# Patient Record
Sex: Male | Born: 1990 | Race: White | Hispanic: No | Marital: Single | Smoking: Former smoker
Health system: Southern US, Community
[De-identification: ages and names within clinical notes are randomized; demographics above are authoritative.]

## PROBLEM LIST (undated history)

## (undated) DIAGNOSIS — I471 Supraventricular tachycardia, unspecified: Secondary | ICD-10-CM

## (undated) DIAGNOSIS — F1610 Hallucinogen abuse, uncomplicated: Secondary | ICD-10-CM

## (undated) DIAGNOSIS — F1410 Cocaine abuse, uncomplicated: Secondary | ICD-10-CM

## (undated) DIAGNOSIS — J9811 Atelectasis: Secondary | ICD-10-CM

## (undated) DIAGNOSIS — F419 Anxiety disorder, unspecified: Secondary | ICD-10-CM

## (undated) HISTORY — DX: Anxiety disorder, unspecified: F41.9

## (undated) HISTORY — PX: NO PAST SURGERIES: SHX2092

---

## 2016-09-05 ENCOUNTER — Encounter (HOSPITAL_COMMUNITY): Payer: Self-pay | Admitting: Emergency Medicine

## 2016-09-05 ENCOUNTER — Emergency Department (HOSPITAL_COMMUNITY)
Admission: EM | Admit: 2016-09-05 | Discharge: 2016-09-05 | Disposition: A | Discharge status: Home / Self Care | Payer: BLUE CROSS/BLUE SHIELD | Attending: Emergency Medicine | Admitting: Emergency Medicine

## 2016-09-05 ENCOUNTER — Emergency Department (HOSPITAL_COMMUNITY): Payer: BLUE CROSS/BLUE SHIELD

## 2016-09-05 DIAGNOSIS — D72829 Elevated white blood cell count, unspecified: Secondary | ICD-10-CM | POA: Insufficient documentation

## 2016-09-05 DIAGNOSIS — I471 Supraventricular tachycardia: Secondary | ICD-10-CM | POA: Insufficient documentation

## 2016-09-05 DIAGNOSIS — R079 Chest pain, unspecified: Secondary | ICD-10-CM

## 2016-09-05 DIAGNOSIS — F17210 Nicotine dependence, cigarettes, uncomplicated: Secondary | ICD-10-CM | POA: Insufficient documentation

## 2016-09-05 DIAGNOSIS — R000 Tachycardia, unspecified: Secondary | ICD-10-CM | POA: Diagnosis present

## 2016-09-05 HISTORY — DX: Supraventricular tachycardia: I47.1

## 2016-09-05 HISTORY — DX: Supraventricular tachycardia, unspecified: I47.10

## 2016-09-05 LAB — BASIC METABOLIC PANEL
Anion gap: 15 (ref 5–15)
BUN: 9 mg/dL (ref 6–20)
CALCIUM: 9.6 mg/dL (ref 8.9–10.3)
CO2: 21 mmol/L — AB (ref 22–32)
CREATININE: 1.18 mg/dL (ref 0.61–1.24)
Chloride: 98 mmol/L — ABNORMAL LOW (ref 101–111)
GFR calc non Af Amer: >60 mL/min (ref >60)
Glucose, Bld: 116 mg/dL — ABNORMAL HIGH (ref 65–99)
Potassium: 3.8 mmol/L (ref 3.5–5.1)
Sodium: 134 mmol/L — ABNORMAL LOW (ref 135–145)

## 2016-09-05 LAB — CBC
HCT: 50.9 % (ref 39.0–52.0)
Hemoglobin: 17.9 g/dL — ABNORMAL HIGH (ref 13.0–17.0)
MCH: 32 pg (ref 26.0–34.0)
MCHC: 35.2 g/dL (ref 30.0–36.0)
MCV: 90.9 fL (ref 78.0–100.0)
PLATELETS: 260 10*3/uL (ref 150–400)
RBC: 5.6 MIL/uL (ref 4.22–5.81)
RDW: 13.1 % (ref 11.5–15.5)
WBC: 20.6 10*3/uL — ABNORMAL HIGH (ref 4.0–10.5)

## 2016-09-05 LAB — I-STAT TROPONIN, ED: TROPONIN I, POC: 0.06 ng/mL (ref 0.00–0.08)

## 2016-09-05 LAB — I-STAT CHEM 8, ED
BUN: 8 mg/dL (ref 6–20)
CREATININE: 1.2 mg/dL (ref 0.61–1.24)
Calcium, Ion: 1.07 mmol/L — ABNORMAL LOW (ref 1.15–1.40)
Chloride: 106 mmol/L (ref 101–111)
GLUCOSE: 115 mg/dL — AB (ref 65–99)
HEMATOCRIT: 54 % — AB (ref 39.0–52.0)
HEMOGLOBIN: 18.4 g/dL — AB (ref 13.0–17.0)
Potassium: 4 mmol/L (ref 3.5–5.1)
Sodium: 138 mmol/L (ref 135–145)
TCO2: 21 mmol/L (ref 0–100)

## 2016-09-05 LAB — ETHANOL: Alcohol, Ethyl (B): 36 mg/dL — ABNORMAL HIGH (ref <5)

## 2016-09-05 MED ORDER — ADENOSINE 6 MG/2ML IV SOLN
INTRAVENOUS | Status: AC
  Administered 2016-09-05: 6 mg via INTRAVENOUS
  Filled 2016-09-05: qty 10

## 2016-09-05 MED ORDER — ADENOSINE 6 MG/2ML IV SOLN
12.0000 mg | Freq: Once | INTRAVENOUS | Status: AC
  Administered 2016-09-05: 12 mg via INTRAVENOUS

## 2016-09-05 MED ORDER — SODIUM CHLORIDE 0.9 % IV BOLUS (SEPSIS)
1000.0000 mL | Freq: Once | INTRAVENOUS | Status: AC
  Administered 2016-09-05: 1000 mL via INTRAVENOUS

## 2016-09-05 MED ORDER — LORAZEPAM 2 MG/ML IJ SOLN
INTRAMUSCULAR | Status: AC
  Administered 2016-09-05: 1 mg
  Filled 2016-09-05: qty 1

## 2016-09-05 MED ORDER — ADENOSINE 6 MG/2ML IV SOLN
6.0000 mg | Freq: Once | INTRAVENOUS | Status: AC
  Administered 2016-09-05: 6 mg via INTRAVENOUS

## 2016-09-05 NOTE — ED Provider Notes (Signed)
[facility]-EMERGENCY DEPT Provider Note   CSN: [id number] Arrival date & time: 09/05/16  1319     History   Chief Complaint Chief Complaint  Patient presents with  . Irregular Heart Beat    HPI Rozanna BoerHayden Thwaites is a 25 y.o. male.  The history is provided by the patient. No language interpreter was used.   Rozanna BoerHayden Oltmann is a 25 y.o. male who presents to the Emergency Department complaining of fast heart beat.  At 9 am he developed sudden onset chest pain, palpitations, nausea, vomiting, SOB and malaise.  He has a hx/o SVT in the past and was treated and [place] at a hospital in [place] and was discharged home on no meds.  He does endorse drinking alcohol and taking an adderall last night.  No SI.  Sxs are severe and constant in nature.    Past Medical History:  Diagnosis Date  . PSVT (paroxysmal supraventricular tachycardia) (HCC)     There are no active problems to display for this patient.   History reviewed. No pertinent surgical history.     Home Medications    Prior to Admission medications   Not on File    Family History No family history on file.  Social History Social History  Substance Use Topics  . Smoking status: Current Every Day Smoker    Types: Cigarettes  . Smokeless tobacco: Never Used  . Alcohol use Yes     Allergies   Patient has no known allergies.   Review of Systems Review of Systems  All other systems reviewed and are negative.    Physical Exam Updated Vital Signs Ht 6\' 1"  (1.854 m)   Wt 140 lb (63.5 kg)   BMI 18.47 kg/m   Physical Exam  Constitutional: He is oriented to person, place, and time. He appears well-developed and well-nourished. He appears distressed.  Ilk appearing  HENT:  Head: Normocephalic and atraumatic.  Cardiovascular: Regular rhythm.   No murmur heard. tachycardic  Pulmonary/Chest: Effort normal and breath sounds normal. No respiratory distress.  Abdominal: Soft. There is no rebound and no guarding.  Mild  abdominal tenderness  Musculoskeletal: He exhibits no edema or tenderness.  Neurological: He is alert and oriented to person, place, and time.  Skin: Skin is warm and dry. There is pallor.  Psychiatric: He has a normal mood and affect. His behavior is normal.  Nursing note and vitals reviewed.    ED Treatments / Results  Labs (all labs ordered are listed, but only abnormal results are displayed) Labs Reviewed  BASIC METABOLIC PANEL  CBC  ETHANOL  I-STAT TROPOININ, ED  I-STAT CHEM 8, ED  I-STAT TROPOININ, ED    EKG  EKG Interpretation None       Radiology No results found.  Procedures Procedures (including critical care time) Chemical cardioversion. Verbal consent obtained from the patient prior to procedure. He was placed on defibrillator pads with constant monitoring and oxygen. Indication: SVT  He was given 6 mg of adenosine with no change in his SVT. He was treated with a second dose of 12 mg adenosine subsequent change to sinus tachycardia. Patient tolerated the procedure well.  Medications Ordered in ED Medications  adenosine (ADENOCARD) 6 MG/2ML injection (not administered)  sodium chloride 0.9 % bolus 1,000 mL (not administered)  adenosine (ADENOCARD) 6 MG/2ML injection 6 mg (not administered)     Initial Impression / Assessment and Plan / ED Course  I have reviewed the triage vital signs and the nursing notes.  Pertinent labs & imaging results that were available during my care of the patient were reviewed by me and considered in my medical decision making (see chart for details).  Clinical Course   Patient with history of SVT here with recurrent symptoms. He states that he was told to avoid stimulants and did take an Adderall last night. He was monitored in the emergency department following chemical cardioversion and tolerated well with improvement in his symptoms. Current clinical picture is not consistent with ACS, PE, sepsis, pneumonia. Discussed  outpatient cardiology follow-up, avoiding stimulants, return precautions.  Final Clinical Impressions(s) / ED Diagnoses   Final diagnoses:  SVT (supraventricular tachycardia) (HCC)  Leukocytosis, unspecified type    New Prescriptions New Prescriptions   No medications on file     Tilden FossaElizabeth Hayzen Lorenson, MD 09/08/16 [phone]

## 2016-09-05 NOTE — ED Notes (Signed)
I have just assisted him to ambulate, which he tolerates well and his heart rate while ambulating is ~90 b.p.m. And is sinus rhythm without ectopy.  He denies any pain and has no requests. His parents are with him and I inform them that his d/c is imminent.

## 2016-09-05 NOTE — ED Notes (Signed)
His skin is normal, warm and dry and he is breathing normally. He is comfortably sleeping. Earlier he had made a phone call to his mom. Monitor shows nsr without ectopy.

## 2016-09-05 NOTE — Discharge Instructions (Signed)
Your white blood cell count is elevated today.  Please follow up with your family doctor to have this rechecked.  Get rechecked immediately if you develop new or worrisome symptoms.

## 2016-09-05 NOTE — ED Triage Notes (Signed)
Patient states that he has PSVT that has been diagnosed with since March this year. Patient reports this episode started this morning around 9am.  Patient having chest pain and nausea.

## 2017-03-21 ENCOUNTER — Emergency Department (HOSPITAL_COMMUNITY)
Admission: EM | Admit: 2017-03-21 | Discharge: 2017-03-21 | Disposition: A | Discharge status: Home / Self Care | Payer: BLUE CROSS/BLUE SHIELD | Attending: Emergency Medicine | Admitting: Emergency Medicine

## 2017-03-21 ENCOUNTER — Encounter (HOSPITAL_COMMUNITY): Payer: Self-pay | Admitting: *Deleted

## 2017-03-21 DIAGNOSIS — F1410 Cocaine abuse, uncomplicated: Secondary | ICD-10-CM

## 2017-03-21 DIAGNOSIS — I471 Supraventricular tachycardia: Secondary | ICD-10-CM

## 2017-03-21 DIAGNOSIS — F17210 Nicotine dependence, cigarettes, uncomplicated: Secondary | ICD-10-CM | POA: Insufficient documentation

## 2017-03-21 DIAGNOSIS — R002 Palpitations: Secondary | ICD-10-CM | POA: Diagnosis present

## 2017-03-21 LAB — CBC
HEMATOCRIT: 45 % (ref 39.0–52.0)
Hemoglobin: 15.5 g/dL (ref 13.0–17.0)
MCH: 30.8 pg (ref 26.0–34.0)
MCHC: 34.4 g/dL (ref 30.0–36.0)
MCV: 89.3 fL (ref 78.0–100.0)
PLATELETS: 228 10*3/uL (ref 150–400)
RBC: 5.04 MIL/uL (ref 4.22–5.81)
RDW: 13 % (ref 11.5–15.5)
WBC: 7.8 10*3/uL (ref 4.0–10.5)

## 2017-03-21 LAB — BASIC METABOLIC PANEL
Anion gap: 11 (ref 5–15)
BUN: 8 mg/dL (ref 6–20)
CHLORIDE: 109 mmol/L (ref 101–111)
CO2: 20 mmol/L — ABNORMAL LOW (ref 22–32)
CREATININE: 0.98 mg/dL (ref 0.61–1.24)
Calcium: 9 mg/dL (ref 8.9–10.3)
GFR calc Af Amer: >60 mL/min (ref >60)
GLUCOSE: 123 mg/dL — AB (ref 65–99)
Potassium: 4 mmol/L (ref 3.5–5.1)
Sodium: 140 mmol/L (ref 135–145)

## 2017-03-21 LAB — I-STAT TROPONIN, ED: Troponin i, poc: 0.01 ng/mL (ref 0.00–0.08)

## 2017-03-21 MED ORDER — ADENOSINE 6 MG/2ML IV SOLN
12.0000 mg | Freq: Once | INTRAVENOUS | Status: AC
  Administered 2017-03-21: 12 mg via INTRAVENOUS

## 2017-03-21 MED ORDER — ADENOSINE 6 MG/2ML IV SOLN
6.0000 mg | Freq: Once | INTRAVENOUS | Status: AC
  Administered 2017-03-21: 6 mg via INTRAVENOUS

## 2017-03-21 NOTE — ED Provider Notes (Signed)
[facility]-EMERGENCY DEPT Provider Note   CSN: [id number] Arrival date & time: 03/21/17  7829   By signing my name below, I, Clarisse Gouge, attest that this documentation has been prepared under the direction and in the presence of Dione Booze, MD. Electronically signed, Clarisse Gouge, ED Scribe. 03/21/17. 3:42 AM.   History   Chief Complaint Chief Complaint  Patient presents with  . SVT   The history is provided by the patient and medical records. No language interpreter was used.    Casey Henry is a 26 y.o. male with h/o PSVT BIB EMS to the Emergency Department concerning tachycardia onset ~10-11 PM last nigh. Associated chest pain and palpitations. He states he got news his cousin was murdered ~10-11 PM last night while the relative was working Office manager at [employer] in [employer] [employer], and pt states he has been in SVT ever since getting this news. Pt expresses a lot anxiety about this situation. He states he has attempted to drink water and vagal maneuvers without relief. He states this is the 3rd instance of SVT, stating his last episode was 09/06/2017. Past cardiology workups noted. ETOH, cocaine and marijuana use noted x the past 3 nights. Triage notes "small amount of cocaine" and alcohol PTA; he reports no cocaine, drug or ETOH use last night. No SOB, fever, diaphoresis. No other complaints at this time.   Past Medical History:  Diagnosis Date  . PSVT (paroxysmal supraventricular tachycardia) (HCC)     There are no active problems to display for this patient.   History reviewed. No pertinent surgical history.     Home Medications    Prior to Admission medications   Not on File    Family History No family history on file.  Social History Social History  Substance Use Topics  . Smoking status: Current Every Day Smoker    Types: Cigarettes  . Smokeless tobacco: Never Used  . Alcohol use Yes     Allergies   Patient has no known allergies.   Review of Systems Review of  Systems  Constitutional: Negative for chills, diaphoresis and fever.  Respiratory: Negative for shortness of breath.   Cardiovascular: Positive for chest pain and palpitations.  Gastrointestinal: Negative for nausea and vomiting.  Psychiatric/Behavioral: The patient is nervous/anxious.   All other systems reviewed and are negative.    Physical Exam Updated Vital Signs BP 92/72   Pulse (!) 223   Resp 16   SpO2 98%   Physical Exam  Constitutional: He is oriented to person, place, and time. He appears well-developed and well-nourished.  HENT:  Head: Normocephalic and atraumatic.  Eyes: Pupils are equal, round, and reactive to light. EOM are normal.  Neck: Normal range of motion. Neck supple. No JVD present.  Cardiovascular: Regular rhythm and normal heart sounds.  Tachycardia present.   No murmur heard. Pulmonary/Chest: Effort normal and breath sounds normal. He has no wheezes. He has no rales. He exhibits no tenderness.  Abdominal: Soft. Bowel sounds are normal. He exhibits no distension and no mass. There is no tenderness.  Musculoskeletal: Normal range of motion. He exhibits no edema.  Lymphadenopathy:    He has no cervical adenopathy.  Neurological: He is alert and oriented to person, place, and time. No cranial nerve deficit. He exhibits normal muscle tone. Coordination normal.  Skin: Skin is warm and dry. No rash noted.  Psychiatric: His behavior is normal. Judgment and thought content normal. His affect is labile.  Nursing note and vitals reviewed.  ED Treatments / Results  DIAGNOSTIC STUDIES: Oxygen Saturation is 98% on RA, NL by my interpretation.    COORDINATION OF CARE: 3:33 AM-Discussed next steps with pt. Pt verbalized understanding and is agreeable with the plan. Will observe and refer to cardiologist.   Labs (all labs ordered are listed, but only abnormal results are displayed) Labs Reviewed  BASIC METABOLIC PANEL - Abnormal; Notable for the following:        Result Value   CO2 20 (*)    Glucose, Bld 123 (*)    All other components within normal limits  CBC  I-STAT TROPOININ, ED    Procedures Procedures (including critical care time) Procedure: Chemical cardioversion Indication: Paroxysmal supraventricular tachycardia Written consent obtained: No Verbal consent obtained: Yes Alternatives discussed: Yes-electrical cardioversion, ED observation Risks of procedure discussed: Yes-asystole following administration of comparison, chest discomfort during administration of adenosine Prior to procedure being done, a timeout was performed Patient identified verbally and with hospital name bracelet He was given 6 mg adenosine with no change in rhythm. Is given 12 mg of adenosine with successful conversion to sinus rhythm, and improvement in blood pressure. He is observed in the ED with stability of this sinus rhythm.  CRITICAL CARE Performed by: Dione BoozeGLICK,Jashaun Penrose Total critical care time: 35 minutes Critical care time was exclusive of separately billable procedures and treating other patients. Critical care was necessary to treat or prevent imminent or life-threatening deterioration. Critical care was time spent personally by me on the following activities: development of treatment plan with patient and/or surrogate as well as nursing, discussions with consultants, evaluation of patient's response to treatment, examination of patient, obtaining history from patient or surrogate, ordering and performing treatments and interventions, ordering and review of laboratory studies, ordering and review of radiographic studies, pulse oximetry and re-evaluation of patient's condition.  Medications Ordered in ED Medications  adenosine (ADENOCARD) 6 MG/2ML injection 6 mg (6 mg Intravenous Given 03/21/17 0330)  adenosine (ADENOCARD) 6 MG/2ML injection 12 mg (12 mg Intravenous Given 03/21/17 0332)     Initial Impression / Assessment and Plan / ED Course  I have  reviewed the triage vital signs and the nursing notes.  Pertinent lab results that were available during my care of the patient were reviewed by me and considered in my medical decision making (see chart for details).  Paroxysmal supraventricular tachycardia. Old records are reviewed, and he has prior ED visit for same condition. Carotid sinus massage attempted with no change in rhythm. He is given adenosine 6 mg with no relief. Is given adenosine 12 mg with successful conversion to sinus rhythm. He is observed in the ED and has had stable rhythm since conversion. He does admit to cocaine use and is advised to abstain from cocaine, amphetamines, and alcohol. He is referred to cardiology for follow-up.  Final Clinical Impressions(s) / ED Diagnoses   Final diagnoses:  PSVT (paroxysmal supraventricular tachycardia) (HCC)  Cocaine abuse    New Prescriptions New Prescriptions   No medications on file   I personally performed the services described in this documentation, which was scribed in my presence. The recorded information has been reviewed and is accurate.       Dione BoozeGlick, Darcey Cardy, MD 03/21/17 [phone]

## 2017-03-21 NOTE — Discharge Instructions (Signed)
Do not use cocaine or other stimulants. Do not drink alcohol. Any of these could cause you to go back into PSVT.

## 2017-03-21 NOTE — ED Notes (Signed)
ED Provider at bedside. 

## 2017-03-21 NOTE — ED Triage Notes (Signed)
Pt received news around midnight about a family member getting murdered; shortly after the news patient felt himself go into SVT. Pt tried vagal maneuvers without improvement. Pt admits to "small amount of cocaine" and alcohol tonight.

## 2020-05-05 ENCOUNTER — Other Ambulatory Visit: Payer: Self-pay

## 2020-05-05 ENCOUNTER — Encounter (HOSPITAL_COMMUNITY): Payer: Self-pay | Admitting: *Deleted

## 2020-05-05 ENCOUNTER — Emergency Department (HOSPITAL_COMMUNITY): Payer: 59

## 2020-05-05 ENCOUNTER — Emergency Department (HOSPITAL_COMMUNITY)
Admission: EM | Admit: 2020-05-05 | Discharge: 2020-05-05 | Disposition: A | Discharge status: Home / Self Care | Payer: 59 | Attending: Emergency Medicine | Admitting: Emergency Medicine

## 2020-05-05 DIAGNOSIS — I471 Supraventricular tachycardia: Secondary | ICD-10-CM | POA: Diagnosis not present

## 2020-05-05 DIAGNOSIS — F17210 Nicotine dependence, cigarettes, uncomplicated: Secondary | ICD-10-CM | POA: Diagnosis not present

## 2020-05-05 DIAGNOSIS — R000 Tachycardia, unspecified: Secondary | ICD-10-CM | POA: Diagnosis present

## 2020-05-05 LAB — BASIC METABOLIC PANEL
Anion gap: 13 (ref 5–15)
BUN: 11 mg/dL (ref 6–20)
CO2: 22 mmol/L (ref 22–32)
Calcium: 9.3 mg/dL (ref 8.9–10.3)
Chloride: 102 mmol/L (ref 98–111)
Creatinine, Ser: 1.04 mg/dL (ref 0.61–1.24)
GFR calc Af Amer: >60 mL/min (ref >60)
GFR calc non Af Amer: >60 mL/min (ref >60)
Glucose, Bld: 126 mg/dL — ABNORMAL HIGH (ref 70–99)
Potassium: 4 mmol/L (ref 3.5–5.1)
Sodium: 137 mmol/L (ref 135–145)

## 2020-05-05 LAB — CBC
HCT: 44.9 % (ref 39.0–52.0)
Hemoglobin: 14.9 g/dL (ref 13.0–17.0)
MCH: 30 pg (ref 26.0–34.0)
MCHC: 33.2 g/dL (ref 30.0–36.0)
MCV: 90.5 fL (ref 80.0–100.0)
Platelets: 230 10*3/uL (ref 150–400)
RBC: 4.96 MIL/uL (ref 4.22–5.81)
RDW: 12.3 % (ref 11.5–15.5)
WBC: 10.2 10*3/uL (ref 4.0–10.5)
nRBC: 0 % (ref 0.0–0.2)

## 2020-05-05 LAB — TROPONIN I (HIGH SENSITIVITY): Troponin I (High Sensitivity): 3 ng/L (ref <18)

## 2020-05-05 MED ORDER — SODIUM CHLORIDE 0.9 % IV BOLUS (SEPSIS)
1000.0000 mL | Freq: Once | INTRAVENOUS | Status: AC
  Administered 2020-05-05: 1000 mL via INTRAVENOUS

## 2020-05-05 MED ORDER — ONDANSETRON HCL 4 MG/2ML IJ SOLN
INTRAMUSCULAR | Status: AC
  Administered 2020-05-05: 4 mg
  Filled 2020-05-05: qty 2

## 2020-05-05 MED ORDER — ADENOSINE 6 MG/2ML IV SOLN
12.0000 mg | Freq: Once | INTRAVENOUS | Status: AC
  Administered 2020-05-05: 12 mg via INTRAVENOUS

## 2020-05-05 MED ORDER — ADENOSINE 6 MG/2ML IV SOLN
INTRAVENOUS | Status: AC
Start: 2020-05-05 — End: 2020-05-05
  Administered 2020-05-05: 05:00:00 6 mg
  Filled 2020-05-05: qty 6

## 2020-05-05 MED ORDER — SODIUM CHLORIDE 0.9 % IV SOLN: 1000.0000 mL | INTRAVENOUS | Status: DC

## 2020-05-05 NOTE — Discharge Instructions (Signed)
Please refrain from using any drugs that will trigger an SVT episode including alcohol, cocaine, amphetamine.  Please continue to hydrate orally.

## 2020-05-05 NOTE — ED Provider Notes (Signed)
[facility] EMERGENCY DEPARTMENT Provider Note  CSN: [id number] Arrival date & time: 05/05/20 4097  Chief Complaint(s) Tachycardia  HPI Casey Henry is a 29 y.o. male with a past medical history of paroxysmal SVT presents to the emergency department with rapid heart rate similar to his prior episodes.  Patient reports that symptoms started 30 to 45 minutes prior to arrival.  Patient did endorse drinking liquor, using marijuana and cocaine earlier this evening.  Patient endorsing associated nausea, shortness of breath, and chest discomfort.  No abdominal pain.  No recent fevers or infections.  He reports attempting vagal maneuvers at home which were unsuccessful.  He reports that he has had multiple episodes requiring adenosine.  HPI  Past Medical History Past Medical History:  Diagnosis Date  . PSVT (paroxysmal supraventricular tachycardia) (HCC)    There are no problems to display for this patient.  Home Medication(s) Prior to Admission medications   Not on File                                                                                                                                    Past Surgical History History reviewed. No pertinent surgical history. Family History No family history on file.  Social History Social History   Tobacco Use  . Smoking status: Current Every Day Smoker    Types: Cigarettes  . Smokeless tobacco: Never Used  Substance Use Topics  . Alcohol use: Yes  . Drug use: Yes    Types: Marijuana, Cocaine   Allergies Patient has no known allergies.  Review of Systems Review of Systems All other systems are reviewed and are negative for acute change except as noted in the HPI  Physical Exam Vital Signs  I have reviewed the triage vital signs BP (!) 77/58 (BP Location: Left Arm)   Pulse (!) 233   Temp 98 F (36.7 C) (Oral)   Resp 20   SpO2 98%   Physical Exam Vitals reviewed.  Constitutional:      General: He is not  in acute distress.    Appearance: He is well-developed. He is not diaphoretic.  HENT:     Head: Normocephalic and atraumatic.     Nose: Nose normal.  Eyes:     General: No scleral icterus.       Right eye: No discharge.        Left eye: No discharge.     Conjunctiva/sclera: Conjunctivae normal.     Pupils: Pupils are equal, round, and reactive to light.  Cardiovascular:     Rate and Rhythm: Regular rhythm. Tachycardia present.     Heart sounds: No murmur heard.  No friction rub. No gallop.   Pulmonary:     Effort: Pulmonary effort is normal. No respiratory distress.     Breath sounds: Normal breath sounds. No stridor. No rales.  Abdominal:     General: There is no distension.  Palpations: Abdomen is soft.     Tenderness: There is no abdominal tenderness.  Musculoskeletal:        General: No tenderness.     Cervical back: Normal range of motion and neck supple.  Skin:    General: Skin is warm and dry.     Coloration: Skin is pale.     Findings: No erythema or rash.  Neurological:     Mental Status: He is alert and oriented to person, place, and time.     ED Results and Treatments Labs (all labs ordered are listed, but only abnormal results are displayed) Labs Reviewed  BASIC METABOLIC PANEL - Abnormal; Notable for the following components:      Result Value   Glucose, Bld 126 (*)    All other components within normal limits  CBC  RAPID URINE DRUG SCREEN, HOSP PERFORMED  TROPONIN I (HIGH SENSITIVITY)  TROPONIN I (HIGH SENSITIVITY)                                                                                                                         EKG  EKG Interpretation  Date/Time:  Sunday May 05 2020 04:38:23 EDT Ventricular Rate:  230 PR Interval:    QRS Duration: 86 QT Interval:  190 QTC Calculation: 371 R Axis:   95 Text Interpretation: Supraventricular tachycardia Rightward axis Marked ST abnormality, possible inferior subendocardial injury Abnormal  ECG Confirmed by Drema Pry [phone]) on 05/05/2020 6:09:22 AM       EKG Interpretation  Date/Time:  Sunday May 05 2020 04:59:52 EDT Ventricular Rate:  77 PR Interval:    QRS Duration: 99 QT Interval:  383 QTC Calculation: 434 R Axis:   94 Text Interpretation: Sinus rhythm Atrial premature complex Borderline right axis deviation RSR' in V1 or V2, probably normal variant Minimal ST depression, lateral leads SVT resolved Confirmed by Drema Pry [phone]) on 05/05/2020 6:11:21 AM        Radiology No results found.  Pertinent labs & imaging results that were available during my care of the patient were reviewed by me and considered in my medical decision making (see chart for details).  Medications Ordered in ED Medications  sodium chloride 0.9 % bolus 1,000 mL (0 mLs Intravenous Stopped 05/05/20 0548)    Followed by  sodium chloride 0.9 % bolus 1,000 mL (1,000 mLs Intravenous New Bag/Given 05/05/20 0548)    Followed by  0.9 %  sodium chloride infusion (has no administration in time range)  adenosine (ADENOCARD) 6 MG/2ML injection (6 mg  Given 05/05/20 0459)  ondansetron (ZOFRAN) 4 MG/2ML injection (4 mg  Given 05/05/20 0459)  adenosine (ADENOCARD) 6 MG/2ML injection 12 mg (12 mg Intravenous Given 05/05/20 0459)  Procedures .1-3 Lead EKG Interpretation Performed by: Nira Connardama, Serenah Mill Eduardo, MD Authorized by: Nira Connardama, Numan Zylstra Eduardo, MD     Interpretation: abnormal     ECG rate:  228   ECG rate assessment: tachycardic     Rhythm: SVT     Ectopy: none     Conduction: normal   .Cardioversion  Date/Time: 05/05/2020 6:12 AM Performed by: Nira Connardama, Maddex Garlitz Eduardo, MD Authorized by: Nira Connardama, Elloise Roark Eduardo, MD   Consent:    Consent obtained:  Verbal and emergent situation   Consent given by:  Patient   Risks discussed:  Induced arrhythmia and death    Alternatives discussed:  Delayed treatment Pre-procedure details:    Cardioversion basis:  Emergent   Rhythm:  Supraventricular tachycardia   Electrode placement:  Anterior-lateral Patient sedated: No Attempt one:    Cardioversion mode attempt one: 6 mg of adenosine.   Shock outcome:  No change in rhythm Attempt two:    Cardioversion mode attempt two: 12 mg of adenosine.   Shock outcome:  Conversion to normal sinus rhythm Post-procedure details:    Patient status:  Awake   Patient tolerance of procedure:  Tolerated well, no immediate complications .Critical Care Performed by: Nira Connardama, Katilynn Sinkler Eduardo, MD Authorized by: Nira Connardama, Rollan Roger Eduardo, MD    CRITICAL CARE Performed by: Amadeo GarnetPedro Eduardo Jaeleah Smyser Total critical care time: 34 minutes Critical care time was exclusive of separately billable procedures and treating other patients. Critical care was necessary to treat or prevent imminent or life-threatening deterioration. Critical care was time spent personally by me on the following activities: development of treatment plan with patient and/or surrogate as well as nursing, discussions with consultants, evaluation of patient's response to treatment, examination of patient, obtaining history from patient or surrogate, ordering and performing treatments and interventions, ordering and review of laboratory studies, ordering and review of radiographic studies, pulse oximetry and re-evaluation of patient's condition.    (including critical care time)  Medical Decision Making / ED Course I have reviewed the nursing notes for this encounter and the patient's prior records (if available in EHR or on provided paperwork).   Rozanna BoerHayden Peek was evaluated in Emergency Department on 05/05/2020 for the symptoms described in the history of present illness. He was evaluated in the context of the global COVID-19 pandemic, which necessitated consideration that the patient might be at risk for infection with  the SARS-CoV-2 virus that causes COVID-19. Institutional protocols and algorithms that pertain to the evaluation of patients at risk for COVID-19 are in a state of rapid change based on information released by regulatory bodies including the CDC and federal and state organizations. These policies and algorithms were followed during the patient's care in the ED.  Patient presents in SVT.  Rates in the 220s.  Patient is hypotensive. Zoll pads immediately placed.  Patient was given IV fluid bolus.  Vagal maneuvers attempted but unsuccessful.  Initial adenosine dose was unsuccessful.  We were able to convert to normal sinus rhythm with 12 mg of adenosine.  This immediately improved the patient's blood pressures which are now in the mid 90s.  We will continue to hydrate and monitor.  Screening labs reassuring.  Chest x-ray reassuring.  Monitor for 2 hours without recurrence. Able to tolerate oral intake.      Final Clinical Impression(s) / ED Diagnoses Final diagnoses:  SVT (supraventricular tachycardia) (HCC)    The patient appears reasonably screened and/or stabilized for discharge and I doubt any other medical condition or other [facility] requiring further screening,  evaluation, or treatment in the ED at this time prior to discharge. Safe for discharge with strict return precautions.  Disposition: Discharge  Condition: Good  I have discussed the results, Dx and Tx plan with the patient/family who expressed understanding and agree(s) with the plan. Discharge instructions discussed at length. The patient/family was given strict return precautions who verbalized understanding of the instructions. No further questions at time of discharge.    ED Discharge Orders    None      Follow Up: Primary care provider  Schedule an appointment as soon as possible for a visit  As needed     This chart was dictated using voice recognition software.  Despite best efforts to proofread,  errors can  occur which can change the documentation meaning.   Nira Conn, MD 05/05/20 [phone]

## 2020-05-05 NOTE — ED Triage Notes (Signed)
Pt states rapid heart rate for about 45 minutes, states hx of SVT. Use of ETOH and marijuana tonight.

## 2020-06-27 ENCOUNTER — Ambulatory Visit: Payer: 59 | Admitting: Internal Medicine

## 2020-07-05 ENCOUNTER — Ambulatory Visit (INDEPENDENT_AMBULATORY_CARE_PROVIDER_SITE_OTHER): Payer: 59 | Admitting: Internal Medicine

## 2020-07-05 ENCOUNTER — Other Ambulatory Visit: Payer: Self-pay

## 2020-07-05 ENCOUNTER — Encounter: Payer: Self-pay | Admitting: Internal Medicine

## 2020-07-05 VITALS — BP 90/60 | HR 82 | Ht 73.0 in | Wt 128.0 lb

## 2020-07-05 DIAGNOSIS — F322 Major depressive disorder, single episode, severe without psychotic features: Secondary | ICD-10-CM

## 2020-07-05 DIAGNOSIS — I471 Supraventricular tachycardia: Secondary | ICD-10-CM | POA: Insufficient documentation

## 2020-07-05 NOTE — Patient Instructions (Addendum)
Medication Instructions:  *If you need a refill on your cardiac medications before your next appointment, please call your pharmacy*  Lab Work: If you have labs (blood work) drawn today and your tests are completely normal, you will receive your results only by: Marland Kitchen MyChart Message (if you have MyChart) OR . A paper copy in the mail If you have any lab test that is abnormal or we need to change your treatment, we will call you to review the results.  Testing/Procedures: Your physician has requested that you have an echocardiogram. Echocardiography is a painless test that uses sound waves to create images of your heart. It provides your doctor with information about the size and shape of your heart and how well your heart's chambers and valves are working. This procedure takes approximately one hour. There are no restrictions for this procedure.  Follow-Up: At [facility], you and your health needs are our priority.  As part of our continuing mission to provide you with exceptional heart care, we have created designated Provider Care Teams.  These Care Teams include your primary Cardiologist (physician) and Advanced Practice Providers (APPs -  Physician Assistants and Nurse Practitioners) who all work together to provide you with the care you need, when you need it.  We recommend signing up for the patient portal called "MyChart".  Sign up information is provided on this After Visit Summary.  MyChart is used to connect with patients for Virtual Visits (Telemedicine).  Patients are able to view lab/test results, encounter notes, upcoming appointments, etc.  Non-urgent messages can be sent to your provider as well.   To learn more about what you can do with MyChart, go to [email or web address].    Your next appointment:   6 month(s)  The format for your next appointment:   In Person  Provider:   You may see Dr. Izora Ribas or one of the following Advanced Practice Providers on your  designated Care Team:    Ronie Spies, PA-C  Jacolyn Reedy, PA-C

## 2020-07-05 NOTE — Progress Notes (Signed)
Cardiology Office Note:    Date:  07/05/2020   ID:  Casey Henry, DOB October 21, 1990, MRN [id number]  PCP:  Tally Joe, MD  [facility] Cardiologist:  No primary care provider on file.  Previously saw [facility] ([place] 2017) [facility] Electrophysiologist:  None   CC: palpitations Consulted for the evaluation of SVT at the behest of Tally Joe, MD  History of Present Illness:    Casey Henry is a 29 y.o. male with a hx of PSVT (seen at [facility] 2017, with SVT in 200 bpm with structurally normal heart).  First SVT episodes was 2013.   Patient was skating with his dad and had a red bull and had tachycardia; felt near syncope.  Didn't have these symptoms until 2017 and have tachycardia after standing up too quickly.  Had been triggers that include stress.  Has always abated with adenosine.  Never been on suppressive medications.  Has stopped caffeine and marijuana as a possible trigger and have stopped them both.  Has had this in the realm of 6 times since 2017.  Can abate rhythm with cold water or valsalva.  Past Medical History:  Diagnosis Date  . Anxiety   . PSVT (paroxysmal supraventricular tachycardia) (HCC)     Past Surgical History:  Procedure Laterality Date  . NO PAST SURGERIES      Current Medications: Current Meds  Medication Sig  . clonazePAM (KLONOPIN) 0.5 MG tablet Take 0.5 mg by mouth daily.  Marland Kitchen escitalopram (LEXAPRO) 5 MG tablet Take 5 mg by mouth daily.  . naproxen sodium (ALEVE) 220 MG tablet Take 220-440 mg by mouth daily as needed (pain).     Allergies:   Patient has no known allergies.   Social History   Socioeconomic History  . Marital status: Single    Spouse name: Not on file  . Number of children: Not on file  . Years of education: Not on file  . Highest education level: Not on file  Occupational History  . Not on file  Tobacco Use  . Smoking status: Current Every Day Smoker    Types: Cigarettes  . Smokeless  tobacco: Never Used  Substance and Sexual Activity  . Alcohol use: Yes  . Drug use: Yes    Types: Marijuana, Cocaine  . Sexual activity: Not on file  Other Topics Concern  . Not on file  Social History Narrative  . Not on file   Social Determinants of Health   Financial Resource Strain:   . Difficulty of Paying Living Expenses: Not on file  Food Insecurity:   . Worried About Programme researcher, broadcasting/film/video in the Last Year: Not on file  . Ran Out of Food in the Last Year: Not on file  Transportation Needs:   . Lack of Transportation (Medical): Not on file  . Lack of Transportation (Non-Medical): Not on file  Physical Activity:   . Days of Exercise per Week: Not on file  . Minutes of Exercise per Session: Not on file  Stress:   . Feeling of Stress : Not on file  Social Connections:   . Frequency of Communication with Friends and Family: Not on file  . Frequency of Social Gatherings with Friends and Family: Not on file  . Attends Religious Services: Not on file  . Active Member of Clubs or Organizations: Not on file  . Attends Banker Meetings: Not on file  . Marital Status: Not on file    Family History:  The patient's history is positive for arrhythmia in both parents  ROS:   Please see the history of present illness.    Positive for self harm thoughts. No plan. Buffered against any self harm because he has family including a son that he needs to be here for  All other systems reviewed and are negative.  EKGs/Labs/Other Studies Reviewed:    The following studies were reviewed today:  EKG:   05/05/20 SR 77 borderline LVH 05/05/20 SVT rate 230 with ST depression  Recent Labs: 05/05/2020: BUN 11; Creatinine, Ser 1.04; Hemoglobin 14.9; Platelets 230; Potassium 4.0; Sodium 137   Physical Exam:    VS:  BP 90/60   Pulse 82   Ht 6\' 1"  (1.854 m)   Wt 128 lb (58.1 kg)   SpO2 97%   BMI 16.89 kg/m     Wt Readings from Last 3 Encounters:  07/05/20 128 lb (58.1 kg)    09/05/16 140 lb (63.5 kg)    GEN: Well nourished, well developed in no acute distress HEENT: Normal NECK: No JVD; No carotid bruits LYMPHATICS: No lymphadenopathy CARDIAC: RRR, no murmurs, rubs, gallops RESPIRATORY:  Clear to auscultation without rales, wheezing or rhonchi  ABDOMEN: Soft, non-tender, non-distended MUSCULOSKELETAL:  No edema; No deformity  SKIN: Warm and dry NEUROLOGIC:  Alert and oriented x 3 PSYCHIATRIC:  Depressed Mood and Affect  ASSESSMENT:    1. SVT (supraventricular tachycardia) (HCC)   2. Current severe episode of major depressive disorder without psychotic features without prior episode (HCC)    PLAN:    In order of problems listed above:  SVT: adenosine sensitive Major Depression Disorder - Discussed the risks and benefits of SVT suppression with medication in the setting of AV nodal agents; my concern right now is this would worsen his depression and SI - No plans for self harm and has protective factors in a desire to be here for family - no AV nodal agents - will get echocardiogram to help decide whether or not to pursue EP and ablation  Will follow in 6 months   Medication Adjustments/Labs and Tests Ordered: Current medicines are reviewed at length with the patient today.  Concerns regarding medicines are outlined above.  Orders Placed This Encounter  Procedures  . ECHOCARDIOGRAM COMPLETE   No orders of the defined types were placed in this encounter.   Patient Instructions  Medication Instructions:  *If you need a refill on your cardiac medications before your next appointment, please call your pharmacy*  Lab Work: If you have labs (blood work) drawn today and your tests are completely normal, you will receive your results only by: 09/07/16 MyChart Message (if you have MyChart) OR . A paper copy in the mail If you have any lab test that is abnormal or we need to change your treatment, we will call you to review the  results.  Testing/Procedures: Your physician has requested that you have an echocardiogram. Echocardiography is a painless test that uses sound waves to create images of your heart. It provides your doctor with information about the size and shape of your heart and how well your heart's chambers and valves are working. This procedure takes approximately one hour. There are no restrictions for this procedure.  Follow-Up: At [facility], you and your health needs are our priority.  As part of our continuing mission to provide you with exceptional heart care, we have created designated Provider Care Teams.  These Care Teams include your primary Cardiologist (physician) and Advanced  Practice Providers (APPs -  Physician Assistants and Nurse Practitioners) who all work together to provide you with the care you need, when you need it.  We recommend signing up for the patient portal called "MyChart".  Sign up information is provided on this After Visit Summary.  MyChart is used to connect with patients for Virtual Visits (Telemedicine).  Patients are able to view lab/test results, encounter notes, upcoming appointments, etc.  Non-urgent messages can be sent to your provider as well.   To learn more about what you can do with MyChart, go to [email or web address].    Your next appointment:   6 month(s)  The format for your next appointment:   In Person  Provider:   You may see Dr. Izora Ribas or one of the following Advanced Practice Providers on your designated Care Team:    Ronie Spies, PA-C  Jacolyn Reedy, PA-C         Signed, Christell Constant, MD  07/05/2020 5:12 PM    [facility]

## 2020-07-18 DIAGNOSIS — F4321 Adjustment disorder with depressed mood: Secondary | ICD-10-CM | POA: Diagnosis not present

## 2020-07-26 ENCOUNTER — Other Ambulatory Visit (HOSPITAL_COMMUNITY): Payer: 59

## 2020-08-07 ENCOUNTER — Emergency Department (HOSPITAL_COMMUNITY)
Admission: EM | Admit: 2020-08-07 | Discharge: 2020-08-07 | Disposition: A | Discharge status: Home / Self Care | Payer: Self-pay | Attending: Emergency Medicine | Admitting: Emergency Medicine

## 2020-08-07 ENCOUNTER — Other Ambulatory Visit: Payer: Self-pay

## 2020-08-07 DIAGNOSIS — I471 Supraventricular tachycardia: Secondary | ICD-10-CM | POA: Insufficient documentation

## 2020-08-07 DIAGNOSIS — R079 Chest pain, unspecified: Secondary | ICD-10-CM | POA: Diagnosis not present

## 2020-08-07 DIAGNOSIS — R0789 Other chest pain: Secondary | ICD-10-CM | POA: Diagnosis not present

## 2020-08-07 DIAGNOSIS — F4321 Adjustment disorder with depressed mood: Secondary | ICD-10-CM | POA: Diagnosis not present

## 2020-08-07 DIAGNOSIS — F17210 Nicotine dependence, cigarettes, uncomplicated: Secondary | ICD-10-CM | POA: Insufficient documentation

## 2020-08-07 DIAGNOSIS — R404 Transient alteration of awareness: Secondary | ICD-10-CM | POA: Diagnosis not present

## 2020-08-07 LAB — CBC WITH DIFFERENTIAL/PLATELET
Abs Immature Granulocytes: 0.05 10*3/uL (ref 0.00–0.07)
Basophils Absolute: 0.1 10*3/uL (ref 0.0–0.1)
Basophils Relative: 1 %
Eosinophils Absolute: 0.2 10*3/uL (ref 0.0–0.5)
Eosinophils Relative: 2 %
HCT: 43.7 % (ref 39.0–52.0)
Hemoglobin: 14.5 g/dL (ref 13.0–17.0)
Immature Granulocytes: 1 %
Lymphocytes Relative: 17 %
Lymphs Abs: 1.8 10*3/uL (ref 0.7–4.0)
MCH: 30.8 pg (ref 26.0–34.0)
MCHC: 33.2 g/dL (ref 30.0–36.0)
MCV: 92.8 fL (ref 80.0–100.0)
Monocytes Absolute: 0.5 10*3/uL (ref 0.1–1.0)
Monocytes Relative: 4 %
Neutro Abs: 8.3 10*3/uL — ABNORMAL HIGH (ref 1.7–7.7)
Neutrophils Relative %: 75 %
Platelets: 165 10*3/uL (ref 150–400)
RBC: 4.71 MIL/uL (ref 4.22–5.81)
RDW: 12.6 % (ref 11.5–15.5)
WBC: 10.8 10*3/uL — ABNORMAL HIGH (ref 4.0–10.5)
nRBC: 0 % (ref 0.0–0.2)

## 2020-08-07 LAB — BASIC METABOLIC PANEL
Anion gap: 13 (ref 5–15)
BUN: 13 mg/dL (ref 6–20)
CO2: 21 mmol/L — ABNORMAL LOW (ref 22–32)
Calcium: 8.9 mg/dL (ref 8.9–10.3)
Chloride: 107 mmol/L (ref 98–111)
Creatinine, Ser: 1 mg/dL (ref 0.61–1.24)
GFR, Estimated: >60 mL/min (ref >60)
Glucose, Bld: 97 mg/dL (ref 70–99)
Potassium: 3.9 mmol/L (ref 3.5–5.1)
Sodium: 141 mmol/L (ref 135–145)

## 2020-08-07 LAB — MAGNESIUM: Magnesium: 2 mg/dL (ref 1.7–2.4)

## 2020-08-07 MED ORDER — LACTATED RINGERS IV BOLUS
1000.0000 mL | Freq: Once | INTRAVENOUS | Status: AC
  Administered 2020-08-07: 1000 mL via INTRAVENOUS

## 2020-08-07 NOTE — ED Notes (Signed)
PT REPORTS RESOLVE OF CHEST PAIN. PT DENIES USE OF AT HOME MEDICATION FOR PSVT, REPORTING USE OF LIFESTYLE CHOICES FOR PSVT.

## 2020-08-07 NOTE — ED Triage Notes (Signed)
EMS arrival from home, hx SVT, per EMS pt reports awaking from sleep with accelerated HR x1 hr before calling. Per EMS arrival pt pale HR 208, 80/50 . EMS administered 18mg  Adenosine 20 R forearm, x1 emesis episode, currently denies nausea and chest pain, convert to NSR 80.

## 2020-08-07 NOTE — ED Provider Notes (Signed)
[facility] EMERGENCY DEPARTMENT Provider Note   CSN: [id number] Arrival date & time: 08/07/20  0501     History Chief Complaint  Patient presents with  . Tachycardia    SVT     Casey Henry is a 29 y.o. male.  History of multiple lows but sews of SVT and is actually scheduled to see Dr. Algie Coffer soon.  Patient states he woke up with palpitations similar to previous episodes.  He tried vagal maneuvers and putting his face into an ice bath neither of which helped.  He called EMS.  Apparently his heart rate was in the 240s initially he was pale and diaphoretic.  He was given adenosine which converted him to normal sinus rhythm and brought him here for further evaluation.  Patient is asymptomatic at this time.  Denies drug use.  Did have some whiskey last night but does not abnormal for him.  No recent illnesses.        Past Medical History:  Diagnosis Date  . Anxiety   . PSVT (paroxysmal supraventricular tachycardia) [facility])     Patient Active Problem List   Diagnosis Date Noted  . SVT (supraventricular tachycardia) (HCC) 07/05/2020  . Current severe episode of major depressive disorder without psychotic features without prior episode (HCC) 07/05/2020    Past Surgical History:  Procedure Laterality Date  . NO PAST SURGERIES         No family history on file.  Social History   Tobacco Use  . Smoking status: Current Every Day Smoker    Types: Cigarettes  . Smokeless tobacco: Never Used  Substance Use Topics  . Alcohol use: Yes  . Drug use: Yes    Types: Marijuana, Cocaine    Home Medications Prior to Admission medications   Medication Sig Start Date End Date Taking? Authorizing Provider  clonazePAM (KLONOPIN) 0.5 MG tablet Take 0.5 mg by mouth daily.    [provider]  escitalopram (LEXAPRO) 5 MG tablet Take 5 mg by mouth daily.    [provider]  naproxen sodium (ALEVE) 220 MG tablet Take 220-440 mg by mouth daily as needed  (pain).    [provider]    Allergies    Patient has no known allergies.  Review of Systems   Review of Systems  All other systems reviewed and are negative.   Physical Exam Updated Vital Signs BP 118/88   Pulse 89   Temp 98 F (36.7 C) (Oral)   Resp 15   Ht 6\' 1"  (1.854 m)   Wt 59 kg   SpO2 99%   BMI 17.15 kg/m   Physical Exam Vitals and nursing note reviewed.  Constitutional:      Appearance: He is well-developed.  HENT:     Head: Normocephalic and atraumatic.     Nose: No congestion or rhinorrhea.     Mouth/Throat:     Mouth: Mucous membranes are moist.     Pharynx: Oropharynx is clear.  Eyes:     Pupils: Pupils are equal, round, and reactive to light.  Cardiovascular:     Rate and Rhythm: Normal rate.  Pulmonary:     Effort: Pulmonary effort is normal. No respiratory distress.  Abdominal:     General: There is no distension.  Musculoskeletal:        General: No swelling or tenderness. Normal range of motion.     Cervical back: Normal range of motion.  Skin:    General: Skin is warm and  dry.  Neurological:     General: No focal deficit present.     Mental Status: He is alert.     ED Results / Procedures / Treatments   Labs (all labs ordered are listed, but only abnormal results are displayed) Labs Reviewed  CBC WITH DIFFERENTIAL/PLATELET - Abnormal; Notable for the following components:      Result Value   WBC 10.8 (*)    Neutro Abs 8.3 (*)    All other components within normal limits  BASIC METABOLIC PANEL - Abnormal; Notable for the following components:   CO2 21 (*)    All other components within normal limits  MAGNESIUM    EKG EKG Interpretation  Date/Time:  Wednesday August 07 2020 05:07:01 EST Ventricular Rate:  83 PR Interval:    QRS Duration: 96 QT Interval:  350 QTC Calculation: 412 R Axis:   94 Text Interpretation: Sinus rhythm Left atrial enlargement RSR' in V1 or V2, probably normal variant REPOLARIZATION  ABNORMALITY similar to august Confirmed by Marily Memos [phone]) on 08/07/2020 6:24:39 AM   Radiology No results found.  Procedures Procedures (including critical care time)  Medications Ordered in ED Medications  lactated ringers bolus 1,000 mL (1,000 mLs Intravenous New Bag/Given 08/07/20 0537)    ED Course  I have reviewed the triage vital signs and the nursing notes.  Pertinent labs & imaging results that were available during my care of the patient were reviewed by me and considered in my medical decision making (see chart for details).    MDM Rules/Calculators/A&P                          SVT resolved.  Already has cardiology follow-up.  No electrolyte abnormalities.  Tolerating p.o.  Stable for discharge.  Final Clinical Impression(s) / ED Diagnoses Final diagnoses:  SVT (supraventricular tachycardia) [facility])    Rx / DC Orders ED Discharge Orders    None       Alejah Aristizabal, Barbara Cower, MD 08/07/20 [phone]

## 2020-08-19 DIAGNOSIS — F4321 Adjustment disorder with depressed mood: Secondary | ICD-10-CM | POA: Diagnosis not present

## 2020-08-21 ENCOUNTER — Encounter (HOSPITAL_COMMUNITY): Payer: Self-pay

## 2020-08-21 ENCOUNTER — Ambulatory Visit (HOSPITAL_COMMUNITY): Payer: BC Managed Care – PPO

## 2020-08-21 ENCOUNTER — Other Ambulatory Visit: Payer: Self-pay

## 2020-08-21 ENCOUNTER — Telehealth: Payer: Self-pay | Admitting: Internal Medicine

## 2020-08-21 NOTE — Telephone Encounter (Signed)
Echo was cancel due to patient bringing in his insurance card.  Test was scheduled as being self pay.   We will have to send over to billing for precert.  Patient didn't want to reschedule for now will call back later in the spring to schedule.  Order will be cancel until patient call back.

## 2020-08-29 DIAGNOSIS — F4321 Adjustment disorder with depressed mood: Secondary | ICD-10-CM | POA: Diagnosis not present

## 2020-09-16 DIAGNOSIS — F4321 Adjustment disorder with depressed mood: Secondary | ICD-10-CM | POA: Diagnosis not present

## 2020-09-26 DIAGNOSIS — Z8679 Personal history of other diseases of the circulatory system: Secondary | ICD-10-CM | POA: Diagnosis not present

## 2020-09-26 DIAGNOSIS — F419 Anxiety disorder, unspecified: Secondary | ICD-10-CM | POA: Diagnosis not present

## 2020-10-01 DIAGNOSIS — F4321 Adjustment disorder with depressed mood: Secondary | ICD-10-CM | POA: Diagnosis not present

## 2021-12-15 IMAGING — DX DG CHEST 1V PORT
1 series · 2 of 2 positions shown · non-contrast
Comparison: 09/05/2016

CLINICAL DATA: SVT.

EXAM:
PORTABLE CHEST 1 VIEW

[Series 1: chest · 0.14mm/px · 2 of 2 slices shown]
[im 1/2]
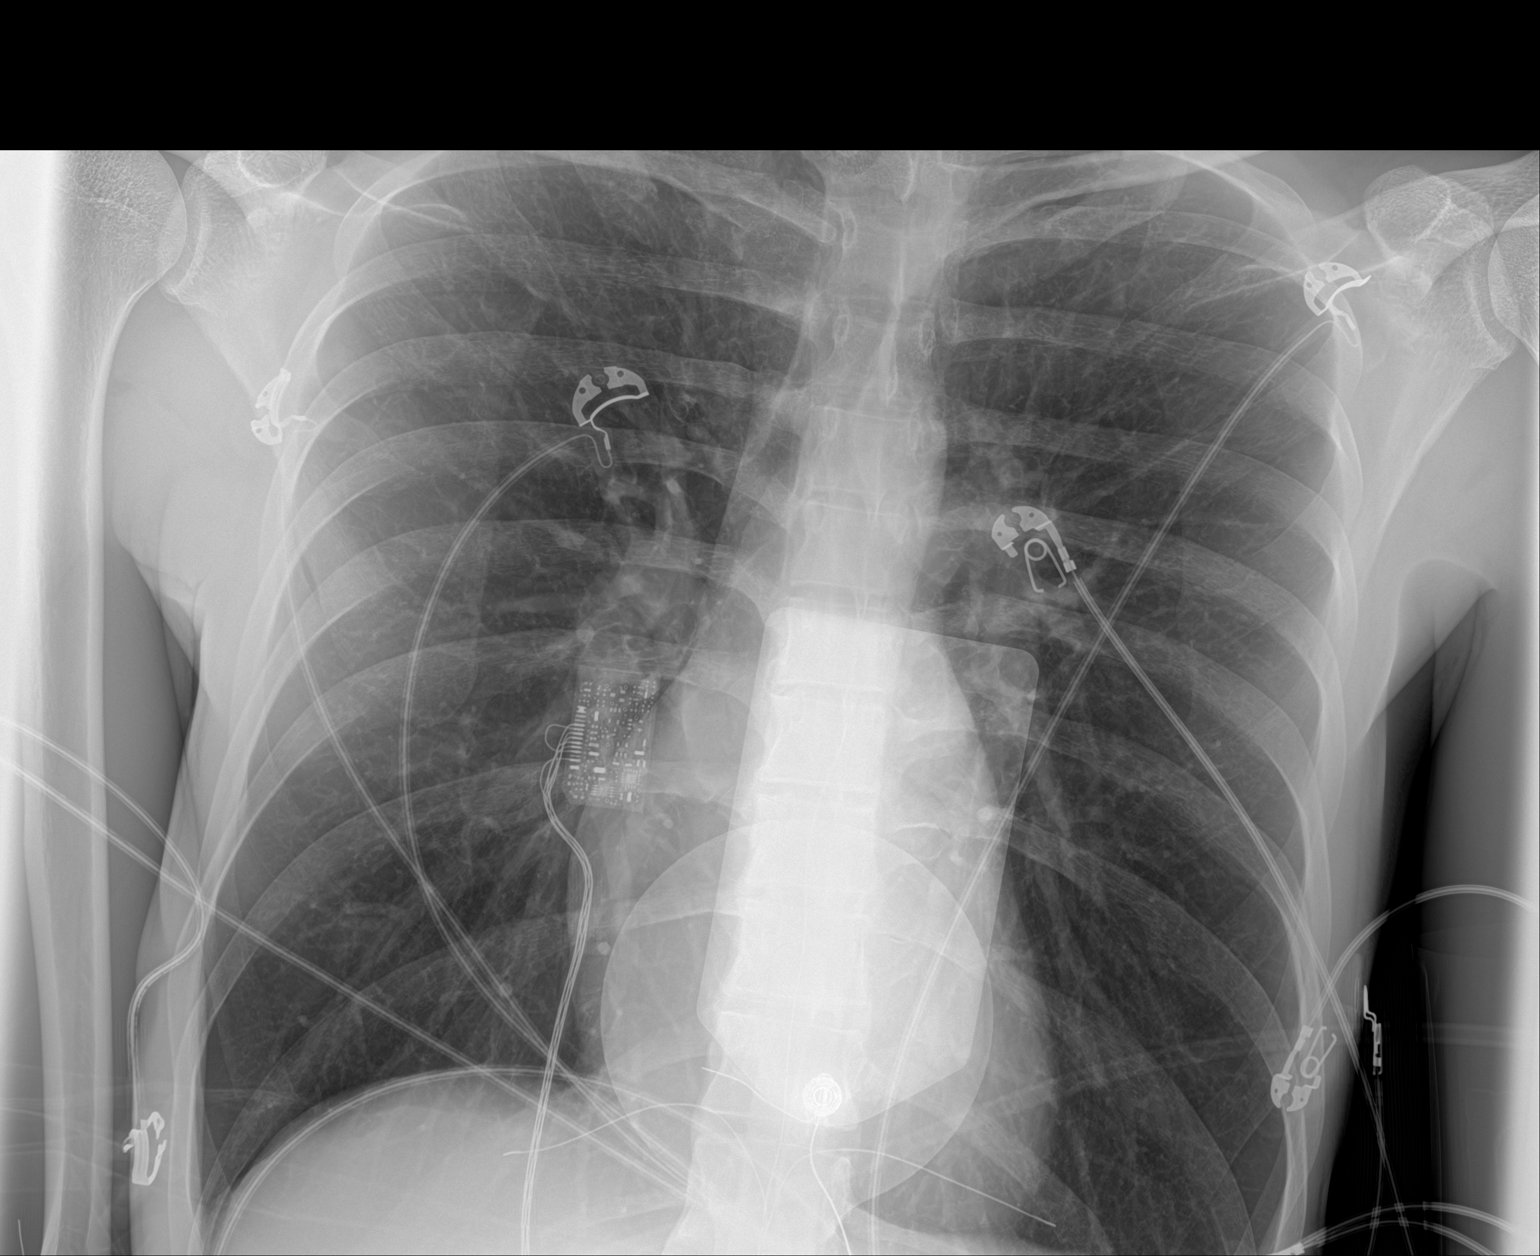
[im 2/2]
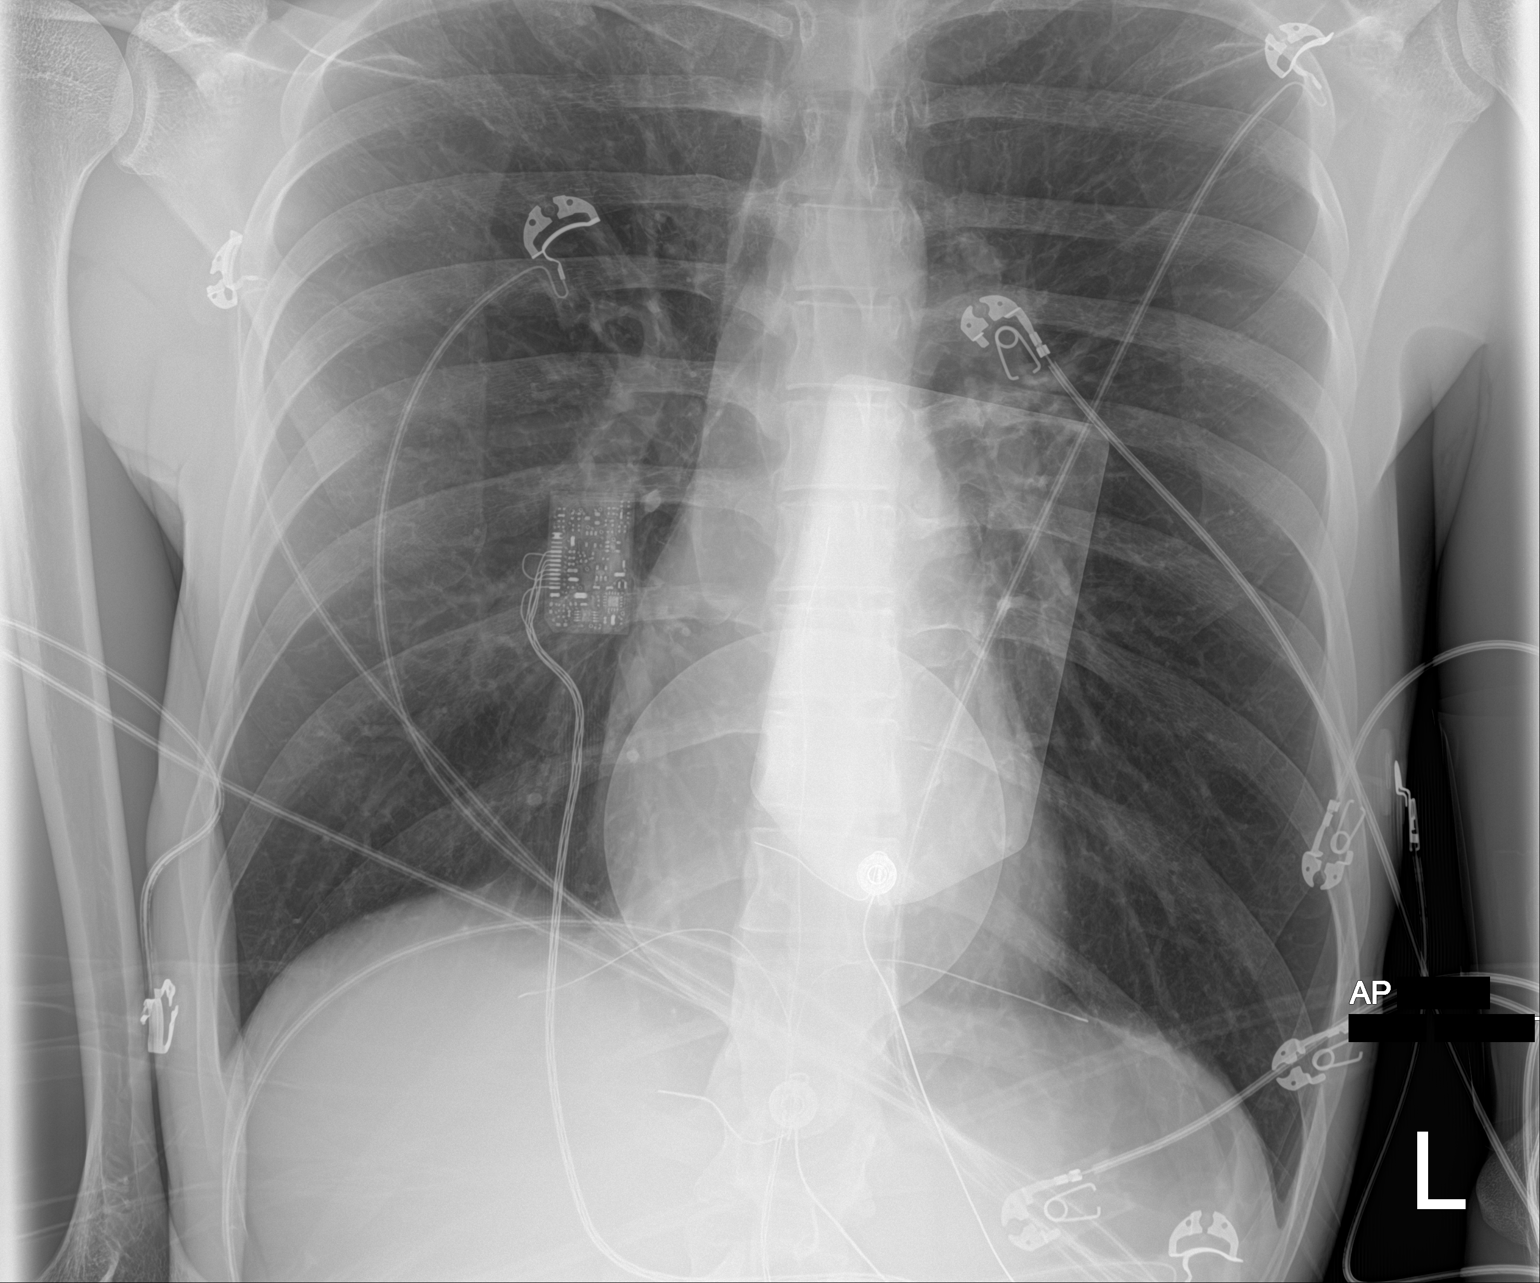

[2 of 2 positions shown; findings below may reference images not displayed]

FINDINGS: Normal heart size. No pleural effusion or edema. The lungs are
hyperinflated but clear. Visualized osseous structures are
unremarkable.
IMPRESSION: No acute cardiopulmonary abnormality.

## 2022-06-10 ENCOUNTER — Emergency Department (HOSPITAL_COMMUNITY)
Admission: EM | Admit: 2022-06-10 | Discharge: 2022-06-10 | Disposition: A | Discharge status: Home / Self Care | Payer: BC Managed Care – PPO | Attending: Emergency Medicine | Admitting: Emergency Medicine

## 2022-06-10 ENCOUNTER — Encounter (HOSPITAL_COMMUNITY): Payer: Self-pay | Admitting: *Deleted

## 2022-06-10 ENCOUNTER — Other Ambulatory Visit: Payer: Self-pay

## 2022-06-10 DIAGNOSIS — K047 Periapical abscess without sinus: Secondary | ICD-10-CM | POA: Insufficient documentation

## 2022-06-10 MED ORDER — PENICILLIN V POTASSIUM 500 MG PO TABS: 500.0000 mg | ORAL_TABLET | Freq: Four times a day (QID) | ORAL | 0 refills | Status: AC

## 2022-06-10 MED ORDER — IBUPROFEN 800 MG PO TABS: 800.0000 mg | ORAL_TABLET | Freq: Three times a day (TID) | ORAL | 0 refills | Status: AC

## 2022-06-10 MED ORDER — KETOROLAC TROMETHAMINE 15 MG/ML IJ SOLN
15.0000 mg | Freq: Once | INTRAMUSCULAR | Status: AC
  Administered 2022-06-10: 15 mg via INTRAMUSCULAR
  Filled 2022-06-10: qty 1

## 2022-06-10 NOTE — Discharge Instructions (Addendum)
You were prescribed medication to help with your pain, please take this medication tomorrow as you receive an anti-inflammatory on today's visit.  You may alternate this with Tylenol for better control of your pain.  The second medication is an antibiotic to help clear your dental infection.  Please take 1 tablet 4 times a day for the next 7 days.  You will need to schedule an appointment with a dentist ASAP.

## 2022-06-10 NOTE — ED Provider Notes (Signed)
[facility]-EMERGENCY DEPT Provider Note   CSN: [id number] Arrival date & time: 06/10/22  1101     History Chief Complaint  Patient presents with   Dental Pain    Casey Henry is a 31 y.o. male.  31 y.o male with no PMH presents to the ED with a chief complaint of dental pain which has been ongoing for the past year.  Patient reports he was unable to obtain any healthcare insurance, therefore he has not seek to any dental care.  He continues to endorse pain along the upper and lower, saturated with any type of mastication and movement.  He is also endorsing worsening pain along the left side of his face, but no swelling.  He denies any fever.  The history is provided by the patient and medical records.  Dental Pain Location:  Upper and lower Lower teeth location:  32/RL 3rd molar Quality:  Aching and constant Severity:  Moderate Onset quality:  Gradual Duration:  1 year Timing:  Constant Progression:  Worsening Chronicity:  Recurrent Context: cap fell off, dental caries, dental fracture and poor dentition   Relieved by:  Nothing Worsened by:  Cold food/drink Ineffective treatments:  Acetaminophen Associated symptoms: facial pain   Associated symptoms: no fever   Risk factors: lack of dental care and smoking   Risk factors: no alcohol problem, no cancer, no chewing tobacco use and no immunosuppression        Home Medications Prior to Admission medications   Medication Sig Start Date End Date Taking? Authorizing Provider  ibuprofen (ADVIL) 800 MG tablet Take 1 tablet (800 mg total) by mouth 3 (three) times daily for 7 days. 06/10/22 06/17/22 Yes Silvana Holecek, PA-C  penicillin v potassium (VEETID) 500 MG tablet Take 1 tablet (500 mg total) by mouth 4 (four) times daily for 7 days. 06/10/22 06/17/22 Yes Jumanah Hynson, PA-C  clonazePAM (KLONOPIN) 0.5 MG tablet Take 0.5 mg by mouth daily.    [provider]  escitalopram (LEXAPRO) 5 MG tablet Take 5  mg by mouth daily.    [provider]  naproxen sodium (ALEVE) 220 MG tablet Take 220-440 mg by mouth daily as needed (pain).    [provider]      Allergies    Patient has no known allergies.    Review of Systems   Review of Systems  Constitutional:  Negative for fever.  HENT:  Positive for dental problem.     Physical Exam Updated Vital Signs BP (!) 149/111 (BP Location: Left Arm)   Pulse 65   Temp 97.8 F (36.6 C) (Oral)   Resp 17   Ht 6' (1.829 m)   Wt 68 kg   SpO2 98%   BMI 20.34 kg/m  Physical Exam Vitals reviewed.  Constitutional:      Appearance: Normal appearance.  HENT:     Head: Normocephalic and atraumatic.     Mouth/Throat:     Mouth: Mucous membranes are moist.     Pharynx: Oropharynx is clear. Uvula midline.     Tonsils: No tonsillar exudate.      Comments: Multiple missing teeth throughout, poor dentition throughout.  Significant pus on top of both teeth in the posterior aspect. Eyes:     Pupils: Pupils are equal, round, and reactive to light.  Cardiovascular:     Rate and Rhythm: Normal rate.  Pulmonary:     Effort: Pulmonary effort is normal.  Abdominal:     General: Abdomen is flat.  Musculoskeletal:     Cervical back: Normal range of motion and neck supple.  Skin:    General: Skin is warm and dry.  Neurological:     Mental Status: He is alert and oriented to person, place, and time.     ED Results / Procedures / Treatments   Labs (all labs ordered are listed, but only abnormal results are displayed) Labs Reviewed - No data to display  EKG None  Radiology No results found.  Procedures Procedures    Medications Ordered in ED Medications  ketorolac (TORADOL) 15 MG/ML injection 15 mg (has no administration in time range)    ED Course/ Medical Decision Making/ A&P                           Medical Decision Making   Patient presents to the ED with a chief complaint of dental pain that is been ongoing for  approximately 1 year.  Not seek any dental consultation due to lack of financial insurance.  He is here today with worsening pain.  Reported left-sided facial pain exacerbated with any type of mastication or movement of his mouth.  Exacerbated with any eating and drinking.  Has been taking Excedrin with no improvement in symptoms.  During evaluation there is poor dentition throughout, multiple pockets of pus, erythema noted and multiple missing teeth.  Pharynx is clear without any exudate, no deviation in his uvula.  There is some pain with palpation along the left maxillary sinus.  No pain with eye movement.  No fevers, no facial swelling to suggest deep infection.  I did discuss with him treatment with pain medication while in the ED and will be sent home on a short course of antibiotics.  Patient is understandable of obtaining further care with a dentist.  Provided with resources for dental clinics.  Patient hemodynamically stable for discharge.    Portions of this note were generated with Lobbyist. Dictation errors may occur despite best attempts at proofreading.   Final Clinical Impression(s) / ED Diagnoses Final diagnoses:  Dental infection    Rx / DC Orders ED Discharge Orders          Ordered    ibuprofen (ADVIL) 800 MG tablet  3 times daily        06/10/22 1155    penicillin v potassium (VEETID) 500 MG tablet  4 times daily        06/10/22 [address] PA-C 06/10/22 1200    Blanchie Dessert, MD 06/14/22 1215

## 2022-06-10 NOTE — ED Triage Notes (Signed)
Chronic dental pain on top and bottom posterior left. Has been on and off for a couple of years. Has not followed up due to no insurance.

## 2023-02-10 ENCOUNTER — Inpatient Hospital Stay (HOSPITAL_BASED_OUTPATIENT_CLINIC_OR_DEPARTMENT_OTHER)
Admission: EM | Admit: 2023-02-10 | Discharge: 2023-02-16 | DRG: 164 | Disposition: A | Discharge status: Home / Self Care | Payer: Self-pay | Attending: Internal Medicine | Admitting: Internal Medicine

## 2023-02-10 ENCOUNTER — Emergency Department (HOSPITAL_BASED_OUTPATIENT_CLINIC_OR_DEPARTMENT_OTHER): Payer: Self-pay

## 2023-02-10 ENCOUNTER — Emergency Department (HOSPITAL_BASED_OUTPATIENT_CLINIC_OR_DEPARTMENT_OTHER): Payer: Self-pay | Admitting: Radiology

## 2023-02-10 ENCOUNTER — Encounter (HOSPITAL_BASED_OUTPATIENT_CLINIC_OR_DEPARTMENT_OTHER): Payer: Self-pay

## 2023-02-10 ENCOUNTER — Other Ambulatory Visit: Payer: Self-pay

## 2023-02-10 DIAGNOSIS — J9382 Other air leak: Secondary | ICD-10-CM | POA: Diagnosis present

## 2023-02-10 DIAGNOSIS — E785 Hyperlipidemia, unspecified: Secondary | ICD-10-CM | POA: Diagnosis present

## 2023-02-10 DIAGNOSIS — Z681 Body mass index (BMI) 19 or less, adult: Secondary | ICD-10-CM

## 2023-02-10 DIAGNOSIS — J930 Spontaneous tension pneumothorax: Secondary | ICD-10-CM | POA: Diagnosis present

## 2023-02-10 DIAGNOSIS — J939 Pneumothorax, unspecified: Secondary | ICD-10-CM | POA: Diagnosis present

## 2023-02-10 DIAGNOSIS — J9383 Other pneumothorax: Principal | ICD-10-CM | POA: Diagnosis present

## 2023-02-10 DIAGNOSIS — Z79899 Other long term (current) drug therapy: Secondary | ICD-10-CM

## 2023-02-10 DIAGNOSIS — I4719 Other supraventricular tachycardia: Secondary | ICD-10-CM | POA: Diagnosis present

## 2023-02-10 DIAGNOSIS — J439 Emphysema, unspecified: Secondary | ICD-10-CM | POA: Diagnosis present

## 2023-02-10 DIAGNOSIS — E440 Moderate protein-calorie malnutrition: Secondary | ICD-10-CM | POA: Diagnosis present

## 2023-02-10 DIAGNOSIS — F17290 Nicotine dependence, other tobacco product, uncomplicated: Secondary | ICD-10-CM | POA: Diagnosis present

## 2023-02-10 HISTORY — DX: Atelectasis: J98.11

## 2023-02-10 HISTORY — DX: Cocaine abuse, uncomplicated: F14.10

## 2023-02-10 HISTORY — DX: Hallucinogen abuse, uncomplicated: F16.10

## 2023-02-10 LAB — CBC WITH DIFFERENTIAL/PLATELET
Abs Immature Granulocytes: 0.02 10*3/uL (ref 0.00–0.07)
Basophils Absolute: 0.1 10*3/uL (ref 0.0–0.1)
Basophils Relative: 1 %
Eosinophils Absolute: 0.3 10*3/uL (ref 0.0–0.5)
Eosinophils Relative: 4 %
HCT: 43 % (ref 39.0–52.0)
Hemoglobin: 15 g/dL (ref 13.0–17.0)
Immature Granulocytes: 0 %
Lymphocytes Relative: 38 %
Lymphs Abs: 2.7 10*3/uL (ref 0.7–4.0)
MCH: 30.2 pg (ref 26.0–34.0)
MCHC: 34.9 g/dL (ref 30.0–36.0)
MCV: 86.5 fL (ref 80.0–100.0)
Monocytes Absolute: 0.4 10*3/uL (ref 0.1–1.0)
Monocytes Relative: 6 %
Neutro Abs: 3.6 10*3/uL (ref 1.7–7.7)
Neutrophils Relative %: 51 %
Platelets: 214 10*3/uL (ref 150–400)
RBC: 4.97 MIL/uL (ref 4.22–5.81)
RDW: 12.2 % (ref 11.5–15.5)
WBC: 7.1 10*3/uL (ref 4.0–10.5)
nRBC: 0 % (ref 0.0–0.2)

## 2023-02-10 LAB — BASIC METABOLIC PANEL
Anion gap: 9 (ref 5–15)
BUN: 10 mg/dL (ref 6–20)
CO2: 27 mmol/L (ref 22–32)
Calcium: 9.3 mg/dL (ref 8.9–10.3)
Chloride: 105 mmol/L (ref 98–111)
Creatinine, Ser: 0.77 mg/dL (ref 0.61–1.24)
GFR, Estimated: >60 mL/min (ref >60)
Glucose, Bld: 80 mg/dL (ref 70–99)
Potassium: 3.6 mmol/L (ref 3.5–5.1)
Sodium: 141 mmol/L (ref 135–145)

## 2023-02-10 MED ORDER — HYDROMORPHONE HCL 1 MG/ML IJ SOLN
0.5000 mg | Freq: Once | INTRAMUSCULAR | Status: AC
  Administered 2023-02-10: 0.5 mg via INTRAVENOUS
  Filled 2023-02-10: qty 1

## 2023-02-10 MED ORDER — CEFAZOLIN SODIUM-DEXTROSE 1-4 GM/50ML-% IV SOLN
1.0000 g | Freq: Once | INTRAVENOUS | Status: AC
  Administered 2023-02-10: 1 g via INTRAVENOUS
  Filled 2023-02-10: qty 50

## 2023-02-10 MED ORDER — LIDOCAINE HCL (PF) 1 % IJ SOLN
30.0000 mL | Freq: Once | INTRAMUSCULAR | Status: DC
  Filled 2023-02-10: qty 30

## 2023-02-10 MED ORDER — KETAMINE HCL 10 MG/ML IJ SOLN
0.3000 mg/kg | Freq: Once | INTRAMUSCULAR | Status: AC
  Administered 2023-02-10: 18 mg via INTRAVENOUS
  Filled 2023-02-10: qty 1

## 2023-02-10 MED ORDER — FENTANYL CITRATE PF 50 MCG/ML IJ SOSY
50.0000 ug | PREFILLED_SYRINGE | Freq: Once | INTRAMUSCULAR | Status: DC
  Filled 2023-02-10: qty 1

## 2023-02-10 NOTE — ED Notes (Signed)
Chest tube secured by Dr. Suezanne Jacquet, xeroform and Tegaderm applied over insertion site, tube secure with sutures.

## 2023-02-10 NOTE — ED Triage Notes (Signed)
Pt comes from [facility] with pneumothorax. Pt has been SHOB since Saturday morning.

## 2023-02-10 NOTE — ED Notes (Signed)
Xray tech at bedside for CXR.

## 2023-02-10 NOTE — Sedation Documentation (Signed)
Dr. Suezanne Jacquet started procedure with first cut to left lateral 4th intercostal space, proper cleaning solution to area of chest tube insertion side prior to first cut with scalpel, inserting site marked properly, sterile drape applied, lido has been administer at insertion site for numbing agent, procedure has begun.

## 2023-02-10 NOTE — ED Provider Notes (Signed)
[place] EMERGENCY DEPARTMENT AT [facility] Provider Note  CSN: [id number] Arrival date & time: 02/10/23 2037  Chief Complaint(s) Shortness of Breath  HPI Casey Henry is a 32 y.o. male with history of SVT presenting to the emergency department shortness of breath.  Patient reports chest pain and shortness of breath starting Saturday.  Reports he is moving his furniture and then felt sharp pain in the left side.  He reports that it has not gotten better so he went to urgent care.  They diagnosed him with a pneumothorax.  He denies any falls or trauma.  Denies any motor vehicle accidents.  Denies any previous episodes.  He presented to the emergency department for further evaluation.   Past Medical History Past Medical History:  Diagnosis Date   Anxiety    Collapse of left lung    02/10/2023   PSVT (paroxysmal supraventricular tachycardia)    Patient Active Problem List   Diagnosis Date Noted   SVT (supraventricular tachycardia) 07/05/2020   Current severe episode of major depressive disorder without psychotic features without prior episode (HCC) 07/05/2020   Home Medication(s) Prior to Admission medications   Medication Sig Start Date End Date Taking? Authorizing Provider  clonazePAM (KLONOPIN) 0.5 MG tablet Take 0.5 mg by mouth daily.    [provider]  escitalopram (LEXAPRO) 5 MG tablet Take 5 mg by mouth daily.    [provider]  naproxen sodium (ALEVE) 220 MG tablet Take 220-440 mg by mouth daily as needed (pain).    [provider]                                                                                                                                    Past Surgical History Past Surgical History:  Procedure Laterality Date   NO PAST SURGERIES     Family History History reviewed. No pertinent family history.  Social History Social History   Tobacco Use   Smoking status: Former    Types: Cigarettes   Smokeless tobacco:  Never  Vaping Use   Vaping Use: Every day  Substance Use Topics   Alcohol use: Not Currently   Drug use: Yes    Types: Marijuana, Cocaine    Comment: Last cocaine (02/07/2023) last THC (2 weeks ago)   Allergies Fentanyl  Review of Systems Review of Systems  All other systems reviewed and are negative.   Physical Exam Vital Signs  I have reviewed the triage vital signs BP (!) 140/81   Pulse 85   Temp 98.1 F (36.7 C) (Oral)   Resp 17   Ht 6\' 1"  (1.854 m)   Wt 59.9 kg   SpO2 94%   BMI 17.42 kg/m  Physical Exam Vitals and nursing note reviewed.  Constitutional:      General: He is not in acute distress.    Appearance: Normal appearance.  HENT:     Mouth/Throat:  Mouth: Mucous membranes are moist.  Eyes:     Conjunctiva/sclera: Conjunctivae normal.  Cardiovascular:     Rate and Rhythm: Normal rate and regular rhythm.  Pulmonary:     Effort: Pulmonary effort is normal. No respiratory distress.     Breath sounds: Examination of the left-upper field reveals decreased breath sounds. Examination of the left-middle field reveals decreased breath sounds. Examination of the left-lower field reveals decreased breath sounds. Decreased breath sounds present.  Abdominal:     General: Abdomen is flat.     Palpations: Abdomen is soft.     Tenderness: There is no abdominal tenderness.  Musculoskeletal:     Right lower leg: No edema.     Left lower leg: No edema.  Skin:    General: Skin is warm and dry.     Capillary Refill: Capillary refill takes less than 2 seconds.  Neurological:     Mental Status: He is alert and oriented to person, place, and time. Mental status is at baseline.  Psychiatric:        Mood and Affect: Mood normal.        Behavior: Behavior normal.     ED Results and Treatments Labs (all labs ordered are listed, but only abnormal results are displayed) Labs Reviewed  CBC WITH DIFFERENTIAL/PLATELET  BASIC METABOLIC PANEL                                                                                                                           Radiology DG Chest Portable 1 View  Result Date: 02/10/2023 CLINICAL DATA:  Chest tube insertion EXAM: PORTABLE CHEST 1 VIEW COMPARISON:  02/10/2023 FINDINGS: Interval placement of a left chest tube with significant evacuation of pneumothorax and re-expansion of the left lung. There is still a small residual left apical pneumothorax. Right lung is clear. Scattered linear atelectasis or fibrosis in the left lung. No pleural effusions. Mediastinal contours appear intact. IMPRESSION: Interval placement of left chest tube with evacuation of pneumothorax and significant re-expansion of the left lung. Small residual left apical pneumothorax. Electronically Signed   By: Burman Nieves M.D.   On: 02/10/2023 23:45   DG Chest Portable 1 View  Result Date: 02/10/2023 CLINICAL DATA:  Shortness of breath EXAM: PORTABLE CHEST 1 VIEW COMPARISON:  05/05/2020 FINDINGS: Large left pneumothorax with complete collapse of the left lung. No evidence of tension. Right lung clear. Heart and mediastinal contours within normal limits. IMPRESSION: Large left pneumothorax with complete collapse of the left lung. No evidence of tension. These results were called by telephone at the time of interpretation on 02/10/2023 at 9:32 pm to provider Alvino Blood , who verbally acknowledged these results. Electronically Signed   By: Charlett Nose M.D.   On: 02/10/2023 21:34    Pertinent labs & imaging results that were available during my care of the patient were reviewed by me and considered in my medical decision making (see MDM for details).  Medications Ordered in  ED Medications  lidocaine (PF) (XYLOCAINE) 1 % injection 30 mL (30 mLs Intradermal Not Given 02/10/23 2306)  HYDROmorphone (DILAUDID) injection 0.5 mg (0.5 mg Intravenous Given 02/10/23 2218)  HYDROmorphone (DILAUDID) injection 0.5 mg (0.5 mg Intravenous Given 02/10/23 2305)  ceFAZolin  (ANCEF) IVPB 1 g/50 mL premix (1 g Intravenous New Bag/Given 02/10/23 2317)  ketamine (KETALAR) injection 18 mg (18 mg Intravenous Given 02/10/23 2341)                                                                                                                                     Procedures .Critical Care  Performed by: Lonell Grandchild, MD Authorized by: Lonell Grandchild, MD   Critical care provider statement:    Critical care time (minutes):  30   Critical care was necessary to treat or prevent imminent or life-threatening deterioration of the following conditions:  Respiratory failure   Critical care was time spent personally by me on the following activities:  Development of treatment plan with patient or surrogate, discussions with consultants, evaluation of patient's response to treatment, examination of patient, ordering and review of laboratory studies, ordering and review of radiographic studies, ordering and performing treatments and interventions, pulse oximetry, re-evaluation of patient's condition and review of old charts CHEST TUBE INSERTION  Date/Time: 02/10/2023 11:46 PM  Performed by: Lonell Grandchild, MD Authorized by: Lonell Grandchild, MD   Consent:    Consent obtained:  Written and verbal   Consent given by:  Patient   Risks, benefits, and alternatives were discussed: yes     Risks discussed:  Bleeding, damage to surrounding structures, infection, incomplete drainage, nerve damage and pain   Alternatives discussed:  No treatment Universal protocol:    Procedure explained and questions answered to patient or proxy's satisfaction: yes     Relevant documents present and verified: yes     Test results available: yes     Imaging studies available: yes     Required blood products, implants, devices, and special equipment available: yes     Site/side marked: yes     Immediately prior to procedure, a time out was called: yes     Patient identity confirmed:  Arm  band and verbally with patient Pre-procedure details:    Skin preparation:  Chlorhexidine with alcohol   Preparation: Patient was prepped and draped in the usual sterile fashion   Sedation:    Sedation type:  None Anesthesia:    Anesthesia method:  Local infiltration   Local anesthetic:  Lidocaine 1% w/o epi Procedure details:    Placement location:  L lateral   Scalpel size:  11   Tube size ([place]): 14.   Ultrasound guidance: yes     Tension pneumothorax: no     Tube connected to:  Water seal and suction   Drainage characteristics:  Air only   Suture material:  0 silk   Dressing:  4x4 sterile gauze and Xeroform gauze Post-procedure details:    Post-insertion x-ray findings: tube in good position     Procedure completion:  Tolerated well, no immediate complications   (including critical care time)  Medical Decision Making / ED Course   MDM:  32 year old male presenting to the emergency department with chest pain, found to have pneumothorax at urgent care.  Suspect spontaneous pneumothorax given no trauma.  Placed chest tube after consenting patient with reexpansion with some residual pneumothorax.  No blood from tube.  Covered with Ancef given chest tube placement.  Patient continuing to have chest pain and discomfort and lung not fully inflated so we will need to be admitted for further monitoring.  Will place consult for pulm and hospitalist.  Labs overall unremarkable.  Clinical Course as of 02/10/23 2354  Wed Feb 10, 2023  2354 Signed out to Dr. Pilar Plate pending hospitalist/pulm discussion and admission. [WS]    Clinical Course User Index [WS] Lonell Grandchild, MD     Additional history obtained: -Additional history obtained from family -External records from outside source obtained and reviewed including: Chart review including previous notes, labs, imaging, consultation notes including UC note from today   Lab Tests: -I ordered, reviewed, and interpreted labs.    The pertinent results include:   Labs Reviewed  CBC WITH DIFFERENTIAL/PLATELET  BASIC METABOLIC PANEL    Notable for normal results  Imaging Studies ordered: I ordered imaging studies including CXR On my interpretation imaging demonstrates pneumothorax  I independently visualized and interpreted imaging. I agree with the radiologist interpretation   Medicines ordered and prescription drug management: Meds ordered this encounter  Medications   DISCONTD: fentaNYL (SUBLIMAZE) injection 50 mcg   lidocaine (PF) (XYLOCAINE) 1 % injection 30 mL   HYDROmorphone (DILAUDID) injection 0.5 mg   HYDROmorphone (DILAUDID) injection 0.5 mg   ceFAZolin (ANCEF) IVPB 1 g/50 mL premix    Order Specific Question:   Antibiotic Indication:    Answer:   Surgical Prophylaxis   ketamine (KETALAR) injection 18 mg    -I have reviewed the patients home medicines and have made adjustments as needed   Consultations Obtained: I requested consultation with the pulmonologist,  and discussed lab and imaging findings as well as pertinent plan - they recommend: recommendations pending at sign out   Cardiac Monitoring: The patient was maintained on a cardiac monitor.  I personally viewed and interpreted the cardiac monitored which showed an underlying rhythm of: NSR  Social Determinants of Health:  Diagnosis or treatment significantly limited by social determinants of health: polysubstance abuse   Reevaluation: After the interventions noted above, I reevaluated the patient and found that their symptoms have improved  Co morbidities that complicate the patient evaluation  Past Medical History:  Diagnosis Date   Anxiety    Collapse of left lung    02/10/2023   PSVT (paroxysmal supraventricular tachycardia)       Dispostion: Disposition decision including need for hospitalization was considered, and patient disposition pending at time of sign out.    Final Clinical Impression(s) / ED  Diagnoses Final diagnoses:  Spontaneous pneumothorax     This chart was dictated using voice recognition software.  Despite best efforts to proofread,  errors can occur which can change the documentation meaning.    Lonell Grandchild, MD 02/10/23 [phone]

## 2023-02-10 NOTE — ED Notes (Signed)
Pt reports was moving heavy furniture all day Saturday as to when SOB started, unsure that may be source as to collapse left lung, also reports cocaine use on Sunday, last THC use was 2 weeks ago.

## 2023-02-10 NOTE — Sedation Documentation (Addendum)
Chest tube inserted at this time, connected to water seal suction device, suction connected to wall

## 2023-02-11 ENCOUNTER — Observation Stay (HOSPITAL_COMMUNITY): Payer: Self-pay

## 2023-02-11 ENCOUNTER — Encounter (HOSPITAL_BASED_OUTPATIENT_CLINIC_OR_DEPARTMENT_OTHER): Payer: Self-pay

## 2023-02-11 DIAGNOSIS — J9383 Other pneumothorax: Principal | ICD-10-CM | POA: Diagnosis present

## 2023-02-11 DIAGNOSIS — J939 Pneumothorax, unspecified: Secondary | ICD-10-CM | POA: Diagnosis present

## 2023-02-11 LAB — RAPID URINE DRUG SCREEN, HOSP PERFORMED
Amphetamines: NOT DETECTED
Barbiturates: NOT DETECTED
Benzodiazepines: NOT DETECTED
Cocaine: NOT DETECTED
Opiates: POSITIVE — AB
Tetrahydrocannabinol: NOT DETECTED

## 2023-02-11 LAB — HIV ANTIBODY (ROUTINE TESTING W REFLEX): HIV Screen 4th Generation wRfx: NONREACTIVE

## 2023-02-11 MED ORDER — HYDROMORPHONE HCL 1 MG/ML IJ SOLN
1.0000 mg | Freq: Once | INTRAMUSCULAR | Status: AC
  Administered 2023-02-11: 1 mg via INTRAVENOUS
  Filled 2023-02-11: qty 1

## 2023-02-11 MED ORDER — SODIUM CHLORIDE 0.9% FLUSH
10.0000 mL | Freq: Three times a day (TID) | INTRAVENOUS | Status: DC
  Administered 2023-02-11 – 2023-02-16 (×12): 10 mL via INTRAPLEURAL

## 2023-02-11 MED ORDER — BISACODYL 5 MG PO TBEC: 5.0000 mg | DELAYED_RELEASE_TABLET | Freq: Every day | ORAL | Status: DC | PRN

## 2023-02-11 MED ORDER — ESCITALOPRAM OXALATE 10 MG PO TABS
5.0000 mg | ORAL_TABLET | Freq: Every day | ORAL | Status: DC
  Administered 2023-02-12 – 2023-02-16 (×4): 5 mg via ORAL
  Filled 2023-02-11 (×5): qty 1

## 2023-02-11 MED ORDER — CYCLOBENZAPRINE HCL 10 MG PO TABS
5.0000 mg | ORAL_TABLET | Freq: Three times a day (TID) | ORAL | Status: DC | PRN
  Administered 2023-02-11: 5 mg via ORAL
  Filled 2023-02-11 (×2): qty 1

## 2023-02-11 MED ORDER — KETOROLAC TROMETHAMINE 30 MG/ML IJ SOLN
30.0000 mg | Freq: Four times a day (QID) | INTRAMUSCULAR | Status: DC | PRN
  Administered 2023-02-11 – 2023-02-12 (×4): 30 mg via INTRAVENOUS
  Filled 2023-02-11 (×4): qty 1

## 2023-02-11 MED ORDER — MORPHINE SULFATE (PF) 4 MG/ML IV SOLN: 4.0000 mg | INTRAVENOUS | Status: DC | PRN

## 2023-02-11 MED ORDER — CLONAZEPAM 0.5 MG PO TABS
0.5000 mg | ORAL_TABLET | Freq: Every day | ORAL | Status: DC
  Administered 2023-02-12 – 2023-02-16 (×4): 0.5 mg via ORAL
  Filled 2023-02-11 (×4): qty 1

## 2023-02-11 MED ORDER — OXYCODONE HCL 5 MG PO TABS: 5.0000 mg | ORAL_TABLET | ORAL | Status: DC | PRN

## 2023-02-11 MED ORDER — SENNOSIDES-DOCUSATE SODIUM 8.6-50 MG PO TABS: 1.0000 | ORAL_TABLET | Freq: Every evening | ORAL | Status: DC | PRN

## 2023-02-11 MED ORDER — HYDROMORPHONE HCL 1 MG/ML IJ SOLN
0.5000 mg | INTRAMUSCULAR | Status: DC | PRN
  Administered 2023-02-12 – 2023-02-13 (×2): 0.5 mg via INTRAVENOUS
  Filled 2023-02-11 (×2): qty 0.5

## 2023-02-11 NOTE — Progress Notes (Signed)
Pt to [id number] via [facility], VSS, assesment WNL, all needs met, educated on use of call bell, bed in lowest postion, bed alarm on

## 2023-02-11 NOTE — TOC CM/SW Note (Signed)
Transition of Care [facility]) - Inpatient Brief Assessment   Patient Details  Name: Casey Henry MRN: [id number] Date of Birth: Mar 31, 1991  [facility]) CM/SW Contact:    Darrold Span, RN Phone Number: 02/11/2023, 11:17 AM   Clinical Narrative: Pt admitted w/ spont. Pntx- chest tube place. Pt has PCP noted, no insurance.  Transition of Care Department [facility]) has reviewed patient and we will continue to monitor patient advancement through interdisciplinary progression rounds. If new patient transition needs arise, please place a TOC consult.   Transition of Care Asessment: Insurance and Status: Insurance coverage has been reviewed (No insurance)   Home environment has been reviewed: home Prior level of function:: independent Prior/Current Home Services: No current home services Social Determinants of Health Reivew: SDOH reviewed no interventions necessary Readmission risk has been reviewed: Yes Transition of care needs: no transition of care needs at this time

## 2023-02-11 NOTE — ED Notes (Signed)
NRB removed by Dr. Pilar Plate, sats 97% on RA

## 2023-02-11 NOTE — H&P (Signed)
History and Physical    Casey Henry:[id number] DOB: June 10, 1991 DOA: 02/10/2023  PCP: Tally Joe, MD (Confirm with patient/family/NH records and if not entered, this has to be entered at [facility] point of entry) Patient coming from: Home  I have personally briefly reviewed patient's old medical records in [facility]  Chief Complaint: Chest pain, SOB  HPI: Casey Henry is a 32 y.o. male with medical history significant of polysubstance abuse, paroxysmal SVT, anxiety/depression, presented with worsening of left-sided chest pain and shortness of breath.  She and was moving a large heavy furniture on Saturday morning and then suddenly developed sharp-like left-sided chest pain associated with shortness of breath.  He felt that he met just twisted muscle and took OTC medications for the chest pain.  Over the last 3 days however symptoms became worse, chest pain became more constant and even minimal amount of activity can trigger significant shortness of breath. ED Course: None tachycardia nonhypotensive nonhypoxic chest x-ray showed total collapse of left-sided lungs, ED physician, ED physician putting chest tube intracranially and post procedure x-ray showed improvement of lung expansion with minimum apex rescue pneumothorax.  Review of Systems: As per HPI otherwise 14 point review of systems negative.    Past Medical History:  Diagnosis Date   Anxiety    Cocaine abuse (HCC)    Collapse of left lung    02/10/2023   Ketamine use disorder, mild (HCC)    recreational ketamine intermittently   PSVT (paroxysmal supraventricular tachycardia)    from fentanyl per pt    Past Surgical History:  Procedure Laterality Date   NO PAST SURGERIES       reports that he has quit smoking. His smoking use included cigarettes. He has never used smokeless tobacco. He reports that he does not currently use alcohol. He reports current drug use. Drugs: Marijuana and Cocaine.  Allergies  Allergen  Reactions   Duragesic-100 [Fentanyl] Palpitations    Pt reports hx of tachycarda w/ runs of V-tach    History reviewed. No pertinent family history.   Prior to Admission medications   Not on File    Physical Exam: Vitals:   02/11/23 0800 02/11/23 0834 02/11/23 0854 02/11/23 1000  BP: 105/71 120/82  (!) 116/98  Pulse: (!) 57 60 62 (!) 59  Resp: 16 19 15 18   Temp:      TempSrc:      SpO2: 95% 94% 95% 96%  Weight:      Height:        Constitutional: NAD, calm, comfortable Vitals:   02/11/23 0800 02/11/23 0834 02/11/23 0854 02/11/23 1000  BP: 105/71 120/82  (!) 116/98  Pulse: (!) 57 60 62 (!) 59  Resp: 16 19 15 18   Temp:      TempSrc:      SpO2: 95% 94% 95% 96%  Weight:      Height:       Eyes: PERRL, lids and conjunctivae normal ENMT: Mucous membranes are moist. Posterior pharynx clear of any exudate or lesions.Normal dentition.  Neck: normal, supple, no masses, no thyromegaly Respiratory: clear to auscultation bilaterally, no wheezing, no crackles. Normal respiratory effort. No accessory muscle use.  Cardiovascular: Regular rate and rhythm, no murmurs / rubs / gallops. No extremity edema. 2+ pedal pulses. No carotid bruits.  Abdomen: no tenderness, no masses palpated. No hepatosplenomegaly. Bowel sounds positive.  Musculoskeletal: no clubbing / cyanosis. No joint deformity upper and lower extremities. Good ROM, no contractures. Normal muscle tone.  Skin:  no rashes, lesions, ulcers. No induration Neurologic: CN 2-12 grossly intact. Sensation intact, DTR normal. Strength 5/5 in all 4.  Psychiatric: Normal judgment and insight. Alert and oriented x 3. Normal mood.     Labs on Admission: I have personally reviewed following labs and imaging studies  CBC: Recent Labs  Lab 02/10/23 2108  WBC 7.1  NEUTROABS 3.6  HGB 15.0  HCT 43.0  MCV 86.5  PLT 214   Basic Metabolic Panel: Recent Labs  Lab 02/10/23 2108  NA 141  K 3.6  CL 105  CO2 27  GLUCOSE 80  BUN 10   CREATININE 0.77  CALCIUM 9.3   GFR: Estimated Creatinine Clearance: 114.4 mL/min (by C-G formula based on SCr of 0.77 mg/dL). Liver Function Tests: No results for input(s): "AST", "ALT", "ALKPHOS", "BILITOT", "PROT", "ALBUMIN" in the last 168 hours. No results for input(s): "LIPASE", "AMYLASE" in the last 168 hours. No results for input(s): "AMMONIA" in the last 168 hours. Coagulation Profile: No results for input(s): "INR", "PROTIME" in the last 168 hours. Cardiac Enzymes: No results for input(s): "CKTOTAL", "CKMB", "CKMBINDEX", "TROPONINI" in the last 168 hours. BNP (last 3 results) No results for input(s): "PROBNP" in the last 8760 hours. HbA1C: No results for input(s): "HGBA1C" in the last 72 hours. CBG: No results for input(s): "GLUCAP" in the last 168 hours. Lipid Profile: No results for input(s): "CHOL", "HDL", "LDLCALC", "TRIG", "CHOLHDL", "LDLDIRECT" in the last 72 hours. Thyroid Function Tests: No results for input(s): "TSH", "T4TOTAL", "FREET4", "T3FREE", "THYROIDAB" in the last 72 hours. Anemia Panel: No results for input(s): "VITAMINB12", "FOLATE", "FERRITIN", "TIBC", "IRON", "RETICCTPCT" in the last 72 hours. Urine analysis: No results found for: "COLORURINE", "APPEARANCEUR", "LABSPEC", "PHURINE", "GLUCOSEU", "HGBUR", "BILIRUBINUR", "KETONESUR", "PROTEINUR", "UROBILINOGEN", "NITRITE", "LEUKOCYTESUR"  Radiological Exams on Admission: DG Chest Portable 1 View  Result Date: 02/10/2023 CLINICAL DATA:  Chest tube insertion EXAM: PORTABLE CHEST 1 VIEW COMPARISON:  02/10/2023 FINDINGS: Interval placement of a left chest tube with significant evacuation of pneumothorax and re-expansion of the left lung. There is still a small residual left apical pneumothorax. Right lung is clear. Scattered linear atelectasis or fibrosis in the left lung. No pleural effusions. Mediastinal contours appear intact. IMPRESSION: Interval placement of left chest tube with evacuation of pneumothorax  and significant re-expansion of the left lung. Small residual left apical pneumothorax. Electronically Signed   By: Burman Nieves M.D.   On: 02/10/2023 23:45   DG Chest Portable 1 View  Result Date: 02/10/2023 CLINICAL DATA:  Shortness of breath EXAM: PORTABLE CHEST 1 VIEW COMPARISON:  05/05/2020 FINDINGS: Large left pneumothorax with complete collapse of the left lung. No evidence of tension. Right lung clear. Heart and mediastinal contours within normal limits. IMPRESSION: Large left pneumothorax with complete collapse of the left lung. No evidence of tension. These results were called by telephone at the time of interpretation on 02/10/2023 at 9:32 pm to provider Alvino Blood , who verbally acknowledged these results. Electronically Signed   By: Charlett Nose M.D.   On: 02/10/2023 21:34    EKG: None  Assessment/Plan Principal Problem:   Spontaneous pneumothorax Active Problems:   Pneumothorax  (please populate well all problems here in Problem List. (For example, if patient is on BP meds at home and you resume or decide to hold them, it is a problem that needs to be her. Same for CAD, COPD, HLD and so on)  Spontaneous pneumothorax -Continue chest tube support, pulmonary consultation appreciated. 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

## 2023-02-11 NOTE — ED Notes (Signed)
Pt resting and watching TV, conversating with mother at bedside, NAD noted, observed even RR and unlabored, chest tube remains in tact and hooked to suction, no air leak observed, side rails up x2 for safety, plan of care ongoing, pt expresses no needs or concerns at this time, call light within reach, no further concerns as of present.

## 2023-02-11 NOTE — ED Provider Notes (Signed)
  Provider Note MRN:  [id number]  Arrival date & time: 02/11/23    ED Course and Medical Decision Making  Assumed care from [facility] at shift change.  Spontaneous pneumothorax status post chest tube placement.  Still having some discomfort, fearful of going home.  Will discuss admission with pulmonology, hospitalist.  Chest x-ray showing good reexpansion.  Procedures  Final Clinical Impressions(s) / ED Diagnoses     ICD-10-CM   1. Spontaneous pneumothorax  J93.83       ED Discharge Orders     None       Discharge Instructions   None     Elmer Sow. Pilar Plate, MD [facility] Emergency Medicine [facility] [email or web address]

## 2023-02-11 NOTE — Progress Notes (Signed)
[facility] paged regarding lack of orders. Patient reports 10/10 pain. There are no orders.

## 2023-02-11 NOTE — ED Notes (Signed)
[facility] has arrived to transport pt to [facility] [id number]

## 2023-02-11 NOTE — Progress Notes (Signed)
Plan of Care Note for accepted transfer   Patient: Casey Henry MRN: [id number]   DOA: 02/10/2023  Facility requesting transfer: [facility] ED  Requesting Provider: Dr. Pilar Plate  Reason for transfer: Spontaneous pneumothorax  Facility course: 32 year old male with past medical history of anxiety, cocaine and THC abuse, PSVT presented to the ED from urgent care with pneumothorax.  Started having shortness of breath on Saturday after he moved heavy furniture.  CBC and BMP unremarkable.  Chest x-ray showing large left pneumothorax with complete collapse of the left lung; no evidence of tension. Patient required nonrebreather initially, weaned down to room air after placement of chest tube.  Repeat chest x-ray showing reexpansion of the left lung and small residual left apical pneumothorax.  Patient received Dilaudid, ketamine, and cefazolin.  ED physician discussed with pulmonology, will see the patient in consultation in the morning.  Plan of care: The patient is accepted for admission to Progressive unit, at [facility].  [facility] will assume care on arrival to accepting facility. Until arrival, care as per EDP. However, [facility] available 24/7 for questions and assistance.  Author: John Giovanni, MD 02/11/2023  Check [email or web address] for on-call coverage.  Nursing staff, Please call [facility] Admits & Consults System-Wide number on Amion as soon as patient's arrival, so appropriate admitting provider can evaluate the pt.

## 2023-02-11 NOTE — Plan of Care (Signed)

## 2023-02-11 NOTE — ED Notes (Addendum)
ED TO INPATIENT HANDOFF REPORT  ED Nurse Name and Phone #: [phone] [facility]   S Name/Age/Gender Casey Henry 32 y.o. male Room/Bed: DB004/DB004  Code Status   Code Status: Not on file  Home/SNF/Other Home Patient oriented to: self, place, time, and situation Is this baseline? Yes   Triage Complete: Triage complete  Chief Complaint Spontaneous pneumothorax [J93.83]  Triage Note Pt comes from UC with pneumothorax. Pt has been SHOB since Saturday morning.    Allergies Allergies  Allergen Reactions   Fentanyl Palpitations    Pt reprots hx of tachycarda w/ runs of V-tach    Level of Care/Admitting Diagnosis ED Disposition     ED Disposition  Admit   Condition  --   Comment  Hospital Area: [facility] [[id number]]  Level of Care: Progressive [102]  Admit to Progressive based on following criteria: RESPIRATORY PROBLEMS hypoxemic/hypercapnic respiratory failure that is responsive to NIPPV (BiPAP) or High Flow Nasal Cannula (6-80 lpm). Frequent assessment/intervention, no > Q2 hrs < Q4 hrs, to maintain oxygenation and pulmonary hygiene.  Interfacility transfer: Yes  May place patient in observation at [facility] or [facility] if equivalent level of care is available:: Yes  Covid Evaluation: Asymptomatic - no recent exposure (last 10 days) testing not required  Diagnosis: Spontaneous pneumothorax [454098]  Admitting Physician: John Giovanni [[id number]]  Attending Physician: Sabas Sous [[id number]]          B Medical/Surgery History Past Medical History:  Diagnosis Date   Anxiety    Cocaine abuse (HCC)    Collapse of left lung    02/10/2023   Ketamine use disorder, mild (HCC)    recreational ketamine intermittently   PSVT (paroxysmal supraventricular tachycardia)    from fentanyl per pt   Past Surgical History:  Procedure Laterality Date   NO PAST SURGERIES       A IV Location/Drains/Wounds Patient Lines/Drains/Airways Status     Active  Line/Drains/Airways     Name Placement date Placement time Site Days   Peripheral IV 02/10/23 18 G Anterior;Right Forearm 02/10/23  2104  Forearm  1   Chest Tube Lateral;Left Pleural 14 Fr. 02/10/23  2304  Pleural  1            Intake/Output Last 24 hours  Intake/Output Summary (Last 24 hours) at 02/11/2023 0412 Last data filed at 02/10/2023 2348 Gross per 24 hour  Intake 50 ml  Output --  Net 50 ml    Labs/Imaging Results for orders placed or performed during the hospital encounter of 02/10/23 (from the past 48 hour(s))  CBC with Differential     Status: None   Collection Time: 02/10/23  9:08 PM  Result Value Ref Range   WBC 7.1 4.0 - 10.5 K/uL   RBC 4.97 4.22 - 5.81 MIL/uL   Hemoglobin 15.0 13.0 - 17.0 g/dL   HCT 08.6 57.8 - 46.9 %   MCV 86.5 80.0 - 100.0 fL   MCH 30.2 26.0 - 34.0 pg   MCHC 34.9 30.0 - 36.0 g/dL   RDW 62.9 52.8 - 41.3 %   Platelets 214 150 - 400 K/uL   nRBC 0.0 0.0 - 0.2 %   Neutrophils Relative % 51 %   Neutro Abs 3.6 1.7 - 7.7 K/uL   Lymphocytes Relative 38 %   Lymphs Abs 2.7 0.7 - 4.0 K/uL   Monocytes Relative 6 %   Monocytes Absolute 0.4 0.1 - 1.0 K/uL   Eosinophils Relative 4 %  Eosinophils Absolute 0.3 0.0 - 0.5 K/uL   Basophils Relative 1 %   Basophils Absolute 0.1 0.0 - 0.1 K/uL   Immature Granulocytes 0 %   Abs Immature Granulocytes 0.02 0.00 - 0.07 K/uL    Comment: Performed at [facility], [address] [place], [place] [address]  Basic metabolic panel     Status: None   Collection Time: 02/10/23  9:08 PM  Result Value Ref Range   Sodium 141 135 - 145 mmol/L   Potassium 3.6 3.5 - 5.1 mmol/L   Chloride 105 98 - 111 mmol/L   CO2 27 22 - 32 mmol/L   Glucose, Bld 80 70 - 99 mg/dL    Comment: Glucose reference range applies only to samples taken after fasting for at least 8 hours.   BUN 10 6 - 20 mg/dL   Creatinine, Ser 1.91 0.61 - 1.24 mg/dL   Calcium 9.3 8.9 - 47.8 mg/dL   GFR, Estimated >29 >56 mL/min     Comment: (NOTE) Calculated using the CKD-EPI Creatinine Equation (2021)    Anion gap 9 5 - 15    Comment: Performed at [facility], [address] [place], [place] [address]   DG Chest Portable 1 View  Result Date: 02/10/2023 CLINICAL DATA:  Chest tube insertion EXAM: PORTABLE CHEST 1 VIEW COMPARISON:  02/10/2023 FINDINGS: Interval placement of a left chest tube with significant evacuation of pneumothorax and re-expansion of the left lung. There is still a small residual left apical pneumothorax. Right lung is clear. Scattered linear atelectasis or fibrosis in the left lung. No pleural effusions. Mediastinal contours appear intact. IMPRESSION: Interval placement of left chest tube with evacuation of pneumothorax and significant re-expansion of the left lung. Small residual left apical pneumothorax. Electronically Signed   By: Burman Nieves M.D.   On: 02/10/2023 23:45   DG Chest Portable 1 View  Result Date: 02/10/2023 CLINICAL DATA:  Shortness of breath EXAM: PORTABLE CHEST 1 VIEW COMPARISON:  05/05/2020 FINDINGS: Large left pneumothorax with complete collapse of the left lung. No evidence of tension. Right lung clear. Heart and mediastinal contours within normal limits. IMPRESSION: Large left pneumothorax with complete collapse of the left lung. No evidence of tension. These results were called by telephone at the time of interpretation on 02/10/2023 at 9:32 pm to provider Alvino Blood , who verbally acknowledged these results. Electronically Signed   By: Charlett Nose M.D.   On: 02/10/2023 21:34    Pending Labs Unresulted Labs (From admission, onward)    None       Vitals/Pain Today's Vitals   02/11/23 0230 02/11/23 0259 02/11/23 0302 02/11/23 0330  BP: 130/85   113/74  Pulse: 73  78 73  Resp: 20  19 14   Temp:  98.5 F (36.9 C)    TempSrc:  Oral    SpO2: 94%  98% 94%  Weight:      Height:      PainSc:        Isolation Precautions No active  isolations  Medications Medications  lidocaine (PF) (XYLOCAINE) 1 % injection 30 mL (30 mLs Intradermal Not Given 02/10/23 2306)  HYDROmorphone (DILAUDID) injection 0.5 mg (0.5 mg Intravenous Given 02/10/23 2218)  HYDROmorphone (DILAUDID) injection 0.5 mg (0.5 mg Intravenous Given 02/10/23 2305)  ceFAZolin (ANCEF) IVPB 1 g/50 mL premix (0 g Intravenous Stopped 02/10/23 2348)  ketamine (KETALAR) injection 18 mg (18 mg Intravenous Given 02/10/23 2341)  HYDROmorphone (DILAUDID) injection 1 mg (1 mg Intravenous Given  02/11/23 0044)    Mobility walks     Focused Assessments Pulmonary Assessment Handoff:  Lung sounds: L Breath Sounds: Diminished, Absent R Breath Sounds: Clear O2 Device: Room Air      R Recommendations: See Admitting Provider Note  Report given to: Kveon Pedro, RN  Additional Notes: Pt has chest tub to left lateal intercostal space, chest rub hooked to suction, no air leak detective, pt had a large left pneumothorax with complete collapse of the left lung that resolved after chest tube insertion, confirmed by repeat xray. Pt still c/o some intermittent SOB, pain has not improved, pt has been given several dosing of pain medication including ketamine, pt has hx of recreational ketamine use, cocaine use, and THC. Otherwise A&O x4, sats 94-97 % on RA, NSR on monitor, VSS, mother at bedside.

## 2023-02-11 NOTE — ED Notes (Signed)
Pt has left with [facility], NAD noted, VSS, A&O x4, chest tube patent.

## 2023-02-11 NOTE — ED Notes (Signed)
Pt placed on NRB (per Dr. Pilar Plate) for oxygenation support, pt c/o SOB

## 2023-02-11 NOTE — Consult Note (Signed)
NAME:  Casey Henry, MRN:  [id number], DOB:  [birth year], LOS: 0 ADMISSION DATE:  02/10/2023, CONSULTATION DATE:  6/6  REFERRING MD:  TRH  CHIEF COMPLAINT:  SOB   History of Present Illness:  Pt is a 32 yr old male with pmhx of PSVT, Anxiety, Cocaine abuse, and ketamine use disorder presented to [facility] ED with SOB. Patient endorses that SOB began on 6/1 with left sided chest pain while moving furniture. Symptoms continued and went to urgent care where he was diagnosed with a left spontaneous pneumothorax and sent to [facility] ED for further evaluation. Chest tube was placed on left and PCCM was consulted to assist with chest tube maintenance.   Pertinent  Medical History   Past Medical History:  Diagnosis Date   Anxiety    Cocaine abuse (HCC)    Collapse of left lung    02/10/2023   Ketamine use disorder, mild (HCC)    recreational ketamine intermittently   PSVT (paroxysmal supraventricular tachycardia)    from fentanyl per pt     Significant Hospital Events: Including procedures, antibiotic start and stop dates in addition to other pertinent events   6/5 [facility] ED admission for spontaneous pneumothorax left, chest tube placed  6/6 PCCM consult  Interim History / Subjective:  Patient follow commands, SOB improved   Objective   Blood pressure 120/82, pulse 62, temperature 97.7 F (36.5 C), temperature source Oral, resp. rate 15, height 6\' 1"  (1.854 m), weight 59.9 kg, SpO2 95 %.        Intake/Output Summary (Last 24 hours) at 02/11/2023 0945 Last data filed at 02/11/2023 0730 Gross per 24 hour  Intake 50 ml  Output 0 ml  Net 50 ml   Filed Weights   02/10/23 2102  Weight: 59.9 kg    Examination: General: young appearing adult male, lying on floor bed  HENT: Normocephalic, Pink Moist MM, EOM intact  Cardio: s1, s2, auscultated, RRR, no m/r/g Pulm: CTA throughout, diminished on left>right, no distress, -20 suction, small air leak,  Abd: BS active GU: deferred  Skin: warm, dry, left chest  tube-site clean, dry, sutures in place  Extremities: no edema Neuro: Alert and oriented x4, follows commands   Resolved Hospital Problem list   N/a   Assessment & Plan:  Left Spontaneous Pneumothorax  Chest tube placed on 6/5  Repeat chest x-ray 6/5- placement of left chest tube with evacuation of pneumothorax and significant re-expansion of the left lung. Small residual left apical pneumothorax. Small air leak, level 1  P: Flush Chest tube q 8hr  Continue to monitor chest tube for air leak Maintain chest tube on -20 mmhg of suction  Transport with suction for now  Repeat chest x-ray tomorrow  Manage pain with lidocaine patches, PRN flexiril and toradol  Repeat Chest X-ray tomorrow morning 6/7  May benefit from a Non-contrast CT scan   Vaping hx  Smoking hx  Continues to vape, but quit smoking ~years ago P:  Continue education and give resources for quitting   Best Practice (right click and "Reselect all SmartList Selections" daily)   Diet/type: refer to primary  DVT prophylaxis: refer to primary  GI prophylaxis: refer to primary  Lines: N/A Foley:  N/A Code Status:  full code Last date of multidisciplinary goals of care discussion [ patient updated at beside   Labs   CBC: Recent Labs  Lab 02/10/23 2108  WBC 7.1  NEUTROABS 3.6  HGB 15.0  HCT 43.0  MCV 86.5  PLT 214    Basic Metabolic Panel: Recent Labs  Lab 02/10/23 2108  NA 141  K 3.6  CL 105  CO2 27  GLUCOSE 80  BUN 10  CREATININE 0.77  CALCIUM 9.3   GFR: Estimated Creatinine Clearance: 114.4 mL/min (by C-G formula based on SCr of 0.77 mg/dL). Recent Labs  Lab 02/10/23 2108  WBC 7.1    Liver Function Tests: No results for input(s): "AST", "ALT", "ALKPHOS", "BILITOT", "PROT", "ALBUMIN" in the last 168 hours. No results for input(s): "LIPASE", "AMYLASE" in the last 168 hours. No results for input(s): "AMMONIA" in the last 168 hours.  ABG    Component Value Date/Time   TCO2 21  09/05/2016 1353     Coagulation Profile: No results for input(s): "INR", "PROTIME" in the last 168 hours.  Cardiac Enzymes: No results for input(s): "CKTOTAL", "CKMB", "CKMBINDEX", "TROPONINI" in the last 168 hours.  HbA1C: No results found for: "HGBA1C"  CBG: No results for input(s): "GLUCAP" in the last 168 hours.  Review of Systems:   Please see the history of present illness. All other systems reviewed and are negative    Past Medical History:  He,  has a past medical history of Anxiety, Cocaine abuse (HCC), Collapse of left lung, Ketamine use disorder, mild (HCC), and PSVT (paroxysmal supraventricular tachycardia).   Surgical History:   Past Surgical History:  Procedure Laterality Date   NO PAST SURGERIES       Social History:   reports that he has quit smoking. His smoking use included cigarettes. He has never used smokeless tobacco. He reports that he does not currently use alcohol. He reports current drug use. Drugs: Marijuana and Cocaine.   Family History:  His family history is not on file.   Allergies Allergies  Allergen Reactions   Fentanyl Palpitations    Pt reprots hx of tachycarda w/ runs of V-tach     Home Medications  Prior to Admission medications   Medication Sig Start Date End Date Taking? Authorizing Provider  clonazePAM (KLONOPIN) 0.5 MG tablet Take 0.5 mg by mouth daily.    [provider]  escitalopram (LEXAPRO) 5 MG tablet Take 5 mg by mouth daily.    [provider]  naproxen sodium (ALEVE) 220 MG tablet Take 220-440 mg by mouth daily as needed (pain).    [provider]        Avis Epley Katrinka Blazing S-ACNP

## 2023-02-11 NOTE — ED Notes (Signed)
Pt appears to be comfortable and resting, can observe even RR that are unlabored, pt remains on cardiac monitoring devices, no changes noted, side rails up x2 for safety, NAD noted, waiting on transport at this time, call light within reach, no further concerns as of present.  Lights off room for comfort, blind to door open so staff may monitor pt, mother remains at bedside.

## 2023-02-12 ENCOUNTER — Observation Stay (HOSPITAL_COMMUNITY): Payer: Self-pay

## 2023-02-12 DIAGNOSIS — J930 Spontaneous tension pneumothorax: Secondary | ICD-10-CM | POA: Diagnosis present

## 2023-02-12 DIAGNOSIS — J9311 Primary spontaneous pneumothorax: Secondary | ICD-10-CM

## 2023-02-12 MED ORDER — ACETAMINOPHEN 325 MG PO TABS: 650.0000 mg | ORAL_TABLET | Freq: Four times a day (QID) | ORAL | Status: DC | PRN

## 2023-02-12 NOTE — Progress Notes (Signed)
   Casey Henry  UJW:[id number] DOB: Feb 04, 1991 DOA: 02/10/2023 PCP: Tally Joe, MD    Brief Narrative:  32 year old with a history of polysubstance abuse, paroxysmal SVT, and anxiety/depression who presented to the ER with left-sided chest pain and shortness of breath.  When attempting to move a heavy piece of furniture approximately 5 days prior to this admission he developed the acute onset of sharp left-sided chest pain with shortness of breath.  He had been taking over-the-counter medications for this pain.  Unfortunately his symptoms progressively got worse as time went on, to the point that he was not able to exert himself even to a minimal extent without becoming severely short of breath.  He presented to the ER where a CXR noted a full collapse of the left lung.  A chest tube was placed while the patient was still in the ER  Consultants:  PCCM  Goals of Care:  Code Status: Full Code   DVT prophylaxis: Lovenox  Interim Hx: Afebrile.  Vital signs stable.  Modest sinus bradycardia appreciated with stable blood pressure.  Resting comfortably in chair.  Talkative.  In no acute distress.  Denies new symptoms.  Pulmonary has appreciated an ongoing mild intermittent air leak.  Assessment & Plan:  Spontaneous tension pneumothorax Status post pigtail chest tube placement 6/6 - tube management per PCCM - CT chest 6/7 noted small persistent pneumothorax with bilateral left greater than sign right apical blebs -given finding of blebs and mild intermittent airleak thoracic surgery has been consulted  Paroxysmal SVT Quiescent at present  Family Communication: No family present at time of exam Disposition: Unclear at present awaiting TCTS evaluation   Objective: Blood pressure 120/84, pulse (!) 56, temperature 98 F (36.7 C), temperature source Oral, resp. rate 18, height 6\' 1"  (1.854 m), weight 59.9 kg, SpO2 95 %.  Intake/Output Summary (Last 24 hours) at 02/12/2023 0841 Last data filed at  02/12/2023 0606 Gross per 24 hour  Intake --  Output 918 ml  Net -918 ml   Filed Weights   02/10/23 2102  Weight: 59.9 kg    Examination: General: No acute respiratory distress Lungs: Clear to auscultation bilaterally -faint bibasilar crackles Cardiovascular: Regular rate and rhythm without murmur gallop or rub normal S1 and S2 Abdomen: Nontender, nondistended, soft, bowel sounds positive, no rebound, no ascites, no appreciable mass Extremities: No significant cyanosis, clubbing, or edema bilateral lower extremities  CBC: Recent Labs  Lab 02/10/23 2108  WBC 7.1  NEUTROABS 3.6  HGB 15.0  HCT 43.0  MCV 86.5  PLT 214   Basic Metabolic Panel: Recent Labs  Lab 02/10/23 2108  NA 141  K 3.6  CL 105  CO2 27  GLUCOSE 80  BUN 10  CREATININE 0.77  CALCIUM 9.3   GFR: Estimated Creatinine Clearance: 114.4 mL/min (by C-G formula based on SCr of 0.77 mg/dL).   Scheduled Meds:  clonazePAM  0.5 mg Oral Daily   escitalopram  5 mg Oral Daily   lidocaine (PF)  30 mL Intradermal Once   sodium chloride flush  10 mL Intrapleural Q8H     LOS: 0 days   Lonia Blood, MD [facility] Office  [phone] Pager - Text Page per Loretha Stapler  If 7PM-7AM, please contact night-coverage per Amion 02/12/2023, 8:41 AM

## 2023-02-12 NOTE — Progress Notes (Signed)
Pt back to suction @ 20cm per order

## 2023-02-12 NOTE — Consult Note (Addendum)
[address]       [place] [address]             [id number]        Treyvian Donaghey [facility] Medical Record #[id number] Date of Birth: 14-May-1991  Referring: No ref. provider found Primary Care: Tally Joe, MD Primary Cardiologist:None  Chief Complaint:    Chief Complaint  Patient presents with   Shortness of Breath  Reason for consultation:Small, intermittent air leak with left chest tube in place,bilateral small blebs on CT of the chest  History of Present Illness:     This is a 32 year old male with a past medical history of PSVT, anxiety, cocaine abuse, previous cigarette use but now only vaping, and mild ketamine disorder who presented to [facility] ED with complaints of shortness of breath. He was moving furniture this past weekend and he felt a sharp pain on his left side. He denies any trauma or previous episodes. CXR showed large left pneumothorax with complete collapse of the left lung, but no evidence of tension. A left chest tube was placed and follow up chest x ray showed significant re expansion of the left lung (small left apical pneumothorax present). He was transferred to [facility] for further evaluation and treatment. A consultation was obtained with pulmonary/CCM. CT chest done earlier today shows small, left pneumothorax, left greater than right apical blebs, and ground glass opacities of LLL and lingula (likely infectious/inflammatory). Mother in room at the time of my exam. Patient sitting in chair, looking out window. On room air and in no acute distress.  Current Activity/ Functional Status: Patient is independent with mobility/ambulation, transfers, ADL's, IADL's.   Zubrod Score: At the time of surgery this patient's most appropriate activity status/level should be described as: []     0    Normal activity, no symptoms [x]     1    Restricted in physical strenuous activity but ambulatory, able to do out light work []     2    Ambulatory  and capable of self care, unable to do work activities, up and about more than 50%  Of the time                            []     3    Only limited self care, in bed greater than 50% of waking hours []     4    Completely disabled, no self care, confined to bed or chair []     5    Moribund  Past Medical History:  Diagnosis Date   Anxiety    Cocaine abuse (HCC)    Collapse of left lung    02/10/2023   Ketamine use disorder, mild (HCC)    recreational ketamine intermittently   PSVT (paroxysmal supraventricular tachycardia)    from fentanyl per pt    Past Surgical History:  Procedure Laterality Date   Wisdom Teeth      Social History   Tobacco Use  Smoking Status Former   Types: Cigarettes  Smokeless Tobacco Never  He does continue to vape He admits to marijuana use as well Social History   Substance and Sexual Activity  Alcohol Use Not Currently  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   Allergies  Allergen Reactions   Duragesic-100 [Fentanyl] Palpitations    Pt reports hx of tachycarda w/ runs of V-tach    Current Facility-Administered Medications  Medication Dose Route Frequency Provider Last Rate Last Admin   acetaminophen (TYLENOL) tablet 650 mg  650 mg Oral Q6H PRN Lonia Blood, MD       bisacodyl (DULCOLAX) EC tablet 5 mg  5 mg Oral Daily PRN Mikey College T, MD       clonazePAM Scarlette Calico) tablet 0.5 mg  0.5 mg Oral Daily Mikey College T, MD   0.5 mg at 02/12/23 0858   cyclobenzaprine (FLEXERIL) tablet 5 mg  5 mg Oral TID PRN Mikey College T, MD   5 mg at 02/11/23 1008   escitalopram (LEXAPRO) tablet 5 mg  5 mg Oral Daily Mikey College T, MD   5 mg at 02/12/23 0858   HYDROmorphone (DILAUDID) injection 0.5 mg  0.5 mg Intravenous Q4H PRN Mikey College T, MD       ketorolac (TORADOL) 30 MG/ML injection 30 mg  30 mg Intravenous Q6H PRN Mikey College T, MD   30 mg at 02/12/23 1610   senna-docusate (Senokot-S) tablet 1 tablet  1 tablet Oral QHS PRN Mikey College T, MD       sodium  chloride flush (NS) 0.9 % injection 10 mL  10 mL Intrapleural Q8H Simonne Martinet, NP   10 mL at 02/12/23 0342   Family History: History reviewed. No pertinent family history.   Review of Systems:    Cardiac Review of Systems: Y or  [ N   ]= no  Chest Pain [left sided on, in ED  ]  Exertional SOB  [ Y ]     Pedal Edema [ N  ]    Syncope  Klaus.Mock  ]     General Review of Systems: [Y] = yes [ N ]=no Constitional:  nausea [ N ];  fever [ N ];   Resp: cough [ N ];  wheezing[N  ];  hemoptysis[ N ]; GI:  vomiting[ N ];    Heme/Lymph: anemia[N  ];   Psych: anxiety[Y  ];        Physical Exam: BP 113/69 (BP Location: Left Arm)   Pulse (!) 50   Temp (!) 97.4 F (36.3 C) (Oral)   Resp 16   Ht 6\' 1"  (1.854 m)   Wt 59.9 kg   SpO2 97%   BMI 17.42 kg/m    General appearance: alert, cooperative, and no distress Head: Normocephalic, without obvious abnormality Resp: Clear and slightly diminished left apex Cardio: RRR GI: Soft, non tender, bowel sounds present Extremities: No LE edema Neurologic: Grossly normal  Diagnostic Studies & Laboratory data:     Recent Radiology Findings:   CT CHEST WO CONTRAST  Result Date: 02/12/2023 CLINICAL DATA:  Spontaneous pneumothorax. EXAM: CT CHEST WITHOUT CONTRAST TECHNIQUE: Multidetector CT imaging of the chest was performed following the standard protocol without IV contrast. RADIATION DOSE REDUCTION: This exam was performed according to the departmental dose-optimization program which includes automated exposure control, adjustment of the mA and/or kV according to patient size and/or use of iterative reconstruction technique. COMPARISON:  Chest x-ray dated February 11, 2023 FINDINGS: Cardiovascular: No significant vascular findings. Normal heart size. No pericardial effusion. Mediastinum/Nodes: Esophagus and thyroid are unremarkable. Mild soft tissue of the anterior mediastinum, consistent with normal thymus. Enlarged lymph nodes seen in the chest.  Lungs/Pleura: Central airways are patent. Small left pneumothorax with chest tube in place. Ground-glass opacities of the left lower lobe and lingula. Small left-greater-than-right apical blebs. Largest left apical bleb measures 13 mm on series 4, image 42.  No pleural effusion. Upper Abdomen: No acute abnormality. Musculoskeletal: No chest wall mass or suspicious bone lesions identified. IMPRESSION: 1. Small left pneumothorax with chest tube in place. Small left-greater-than-right apical blebs, likely cause of pneumothorax. 2. Ground-glass opacities of the left lower lobe and lingula, likely infectious/inflammatory or due to re-expansion pulmonary edema. Electronically Signed   By: Allegra Lai M.D.   On: 02/12/2023 11:12   DG CHEST PORT 1 VIEW  Result Date: 02/11/2023 CLINICAL DATA:  Follow-up pneumothorax is EXAM: PORTABLE CHEST 1 VIEW COMPARISON:  02/11/2023 FINDINGS: Pigtail catheter is again seen on the left. Previously seen pneumothorax has reduced by proximally 50%. No pneumothorax is noted on the right. No focal infiltrate is seen. No bony abnormality is noted. IMPRESSION: Reduction of the left-sided apical pneumothorax by proximally 50%. Electronically Signed   By: Alcide Clever M.D.   On: 02/11/2023 19:59   DG Chest Port 1 View  Result Date: 02/11/2023 CLINICAL DATA:  Pneumothorax. EXAM: PORTABLE CHEST 1 VIEW COMPARISON:  Radiograph yesterday FINDINGS: Left pneumothorax is minimally diminished in size from yesterday, best appreciated at the apex. Stable positioning of pigtail catheter coiled in the medial mid lung zone. Improved left lung base atelectasis. Normal heart size and mediastinal contours. The right lung is clear. IMPRESSION: 1. Left pneumothorax is minimally decreased in size. Unchanged positioning of left pigtail catheter. 2. Improved left lung base atelectasis. Electronically Signed   By: Narda Rutherford M.D.   On: 02/11/2023 14:32   DG Chest Portable 1 View  Result Date:  02/10/2023 CLINICAL DATA:  Chest tube insertion EXAM: PORTABLE CHEST 1 VIEW COMPARISON:  02/10/2023 FINDINGS: Interval placement of a left chest tube with significant evacuation of pneumothorax and re-expansion of the left lung. There is still a small residual left apical pneumothorax. Right lung is clear. Scattered linear atelectasis or fibrosis in the left lung. No pleural effusions. Mediastinal contours appear intact. IMPRESSION: Interval placement of left chest tube with evacuation of pneumothorax and significant re-expansion of the left lung. Small residual left apical pneumothorax. Electronically Signed   By: Burman Nieves M.D.   On: 02/10/2023 23:45   DG Chest Portable 1 View  Result Date: 02/10/2023 CLINICAL DATA:  Shortness of breath EXAM: PORTABLE CHEST 1 VIEW COMPARISON:  05/05/2020 FINDINGS: Large left pneumothorax with complete collapse of the left lung. No evidence of tension. Right lung clear. Heart and mediastinal contours within normal limits. IMPRESSION: Large left pneumothorax with complete collapse of the left lung. No evidence of tension. These results were called by telephone at the time of interpretation on 02/10/2023 at 9:32 pm to provider Alvino Blood , who verbally acknowledged these results. Electronically Signed   By: Charlett Nose M.D.   On: 02/10/2023 21:34     I have independently reviewed the above radiologic studies and discussed with the patient   Recent Lab Findings: Lab Results  Component Value Date   WBC 7.1 02/10/2023   HGB 15.0 02/10/2023   HCT 43.0 02/10/2023   PLT 214 02/10/2023   GLUCOSE 80 02/10/2023   NA 141 02/10/2023   K 3.6 02/10/2023   CL 105 02/10/2023   CREATININE 0.77 02/10/2023   BUN 10 02/10/2023   CO2 27 02/10/2023  Assessment / Plan:   Spontaneous left pneumothorax-Chest tube in place, to suction,  very intermittent, small air leak. CT chest today shows small left pneumothorax with bilateral blebs (L>R). Dr. Cliffton Asters to evaluate and  determine if surgical intervention will be needed/or not on this admission;he  has no prior history of spontaneous pneumothorax 2. History of cocaine abuse and mild ketamine disorder 3. History of tobacco abuse, now only vaping. Urine drug screen positive for opiates. We discussed cessation   I  spent 20 minutes counseling the patient face to face.   Doree Fudge PA-C 02/12/2023 12:10 PM  Agree with above Left spontaneous pneumothorax with bullous emphysema.  Risks and benefits discussed for L RATS, wedge resection, apical pleurectomy, and mechanical pleurodesis.  Scheduled for 02/13/2023

## 2023-02-12 NOTE — Consult Note (Signed)
NAME:  Casey Henry, MRN:  [id number], DOB:  09-Aug-1991, LOS: 0 ADMISSION DATE:  02/10/2023, CONSULTATION DATE:  6/6  REFERRING MD:  TRH  CHIEF COMPLAINT:  SOB   History of Present Illness:  Pt is a 32 yr old male with pmhx of PSVT, Anxiety, Cocaine abuse, and ketamine use disorder presented to [facility] ED with SOB. Patient endorses that SOB began on 6/1 with left sided chest pain while moving furniture. Symptoms continued and went to urgent care where he was diagnosed with a left spontaneous pneumothorax and sent to [facility] ED for further evaluation. Chest tube was placed on left and PCCM was consulted to assist with chest tube maintenance.   Pertinent  Medical History   Past Medical History:  Diagnosis Date   Anxiety    Cocaine abuse (HCC)    Collapse of left lung    02/10/2023   Ketamine use disorder, mild (HCC)    recreational ketamine intermittently   PSVT (paroxysmal supraventricular tachycardia)    from fentanyl per pt     Significant Hospital Events: Including procedures, antibiotic start and stop dates in addition to other pertinent events   6/5 [facility] ED admission for spontaneous pneumothorax left, chest tube placed  6/6 PCCM consult  Interim History / Subjective:  Patient follow commands, SOB improved   Objective   Blood pressure 120/84, pulse (!) 56, temperature 98 F (36.7 C), temperature source Oral, resp. rate 18, height 6\' 1"  (1.854 m), weight 59.9 kg, SpO2 95 %.        Intake/Output Summary (Last 24 hours) at 02/12/2023 5621 Last data filed at 02/12/2023 0606 Gross per 24 hour  Intake --  Output 918 ml  Net -918 ml    Filed Weights   02/10/23 2102  Weight: 59.9 kg    Examination: General: young appearing adult male, lying on floor bed  HENT: Normocephalic, Pink Moist MM, EOM intact  Cardio: s1, s2, auscultated, RRR, no m/r/g Pulm: CTA throughout, diminished on left>right, no distress, water seal, small air leak,  Abd: BS active GU: deferred  Skin: warm, dry, left  chest tube-site clean, dry, sutures in place  Extremities: no edema Neuro: Alert and oriented x4, follows commands   Resolved Hospital Problem list   N/a   Assessment & Plan:  Left Spontaneous Pneumothorax  Chest tube placed on 6/5  Repeat chest x-ray 6/5- placement of left chest tube with evacuation of pneumothorax and significant re-expansion of the left lung. Small residual left apical pneumothorax. Small air leak, level 1 on water seal  CT 6/7 still showing moderate pnemothorax P: Continue to flush chest tube q 8hr  Continue to monitor chest tube for air leak Maintain Chest tube on suction  CT results pending, will follow up  Manage pain with lidocaine, PRN flexiril and toradol  Continue deep breathing exercises along with IS   Vaping hx  Smoking hx  Continues to vape, but quit smoking ~years ago P:  Continue education and give resources for quitting   Best Practice (right click and "Reselect all SmartList Selections" daily)   Diet/type: refer to primary  DVT prophylaxis: refer to primary  GI prophylaxis: refer to primary  Lines: N/A Foley:  N/A Code Status:  full code Last date of multidisciplinary goals of care discussion [ patient updated at beside   Labs   CBC: Recent Labs  Lab 02/10/23 2108  WBC 7.1  NEUTROABS 3.6  HGB 15.0  HCT 43.0  MCV 86.5  PLT 214  Basic Metabolic Panel: Recent Labs  Lab 02/10/23 2108  NA 141  K 3.6  CL 105  CO2 27  GLUCOSE 80  BUN 10  CREATININE 0.77  CALCIUM 9.3    GFR: Estimated Creatinine Clearance: 114.4 mL/min (by C-G formula based on SCr of 0.77 mg/dL). Recent Labs  Lab 02/10/23 2108  WBC 7.1     Liver Function Tests: No results for input(s): "AST", "ALT", "ALKPHOS", "BILITOT", "PROT", "ALBUMIN" in the last 168 hours. No results for input(s): "LIPASE", "AMYLASE" in the last 168 hours. No results for input(s): "AMMONIA" in the last 168 hours.  ABG    Component Value Date/Time   TCO2 21  09/05/2016 1353     Coagulation Profile: No results for input(s): "INR", "PROTIME" in the last 168 hours.  Cardiac Enzymes: No results for input(s): "CKTOTAL", "CKMB", "CKMBINDEX", "TROPONINI" in the last 168 hours.  HbA1C: No results found for: "HGBA1C"  CBG: No results for input(s): "GLUCAP" in the last 168 hours.  Review of Systems:   Please see the history of present illness. All other systems reviewed and are negative    Past Medical History:  He,  has a past medical history of Anxiety, Cocaine abuse (HCC), Collapse of left lung, Ketamine use disorder, mild (HCC), and PSVT (paroxysmal supraventricular tachycardia).   Surgical History:   Past Surgical History:  Procedure Laterality Date   NO PAST SURGERIES       Social History:   reports that he has quit smoking. His smoking use included cigarettes. He has never used smokeless tobacco. He reports that he does not currently use alcohol. He reports current drug use. Drugs: Marijuana and Cocaine.   Family History:  His family history is not on file.   Allergies Allergies  Allergen Reactions   Duragesic-100 [Fentanyl] Palpitations    Pt reports hx of tachycarda w/ runs of V-tach     Home Medications  Prior to Admission medications   Medication Sig Start Date End Date Taking? Authorizing Provider  clonazePAM (KLONOPIN) 0.5 MG tablet Take 0.5 mg by mouth daily.    [provider]  escitalopram (LEXAPRO) 5 MG tablet Take 5 mg by mouth daily.    [provider]  naproxen sodium (ALEVE) 220 MG tablet Take 220-440 mg by mouth daily as needed (pain).    [provider]        Avis Epley Katrinka Blazing S-ACNP

## 2023-02-13 ENCOUNTER — Inpatient Hospital Stay (HOSPITAL_COMMUNITY): Payer: Self-pay

## 2023-02-13 ENCOUNTER — Other Ambulatory Visit: Payer: Self-pay

## 2023-02-13 ENCOUNTER — Encounter (HOSPITAL_COMMUNITY): Payer: Self-pay | Admitting: Internal Medicine

## 2023-02-13 ENCOUNTER — Inpatient Hospital Stay (HOSPITAL_COMMUNITY): Payer: Self-pay | Admitting: Anesthesiology

## 2023-02-13 ENCOUNTER — Encounter (HOSPITAL_COMMUNITY)
Admission: EM | Disposition: A | Discharge status: Home / Self Care | Payer: Self-pay | Source: Home / Self Care | Attending: Internal Medicine

## 2023-02-13 DIAGNOSIS — Z87891 Personal history of nicotine dependence: Secondary | ICD-10-CM

## 2023-02-13 DIAGNOSIS — J9383 Other pneumothorax: Secondary | ICD-10-CM

## 2023-02-13 DIAGNOSIS — F418 Other specified anxiety disorders: Secondary | ICD-10-CM

## 2023-02-13 DIAGNOSIS — J439 Emphysema, unspecified: Secondary | ICD-10-CM

## 2023-02-13 LAB — SURGICAL PCR SCREEN
MRSA, PCR: NEGATIVE
Staphylococcus aureus: NEGATIVE

## 2023-02-13 SURGERY — WEDGE RESECTION, LUNG, ROBOT-ASSISTED, THORACOSCOPIC
Anesthesia: General | Site: Chest | Laterality: Left

## 2023-02-13 MED ORDER — DEXMEDETOMIDINE HCL IN NACL 80 MCG/20ML IV SOLN
INTRAVENOUS | Status: DC | PRN
  Administered 2023-02-13 (×2): 8 ug via INTRAVENOUS

## 2023-02-13 MED ORDER — LACTATED RINGERS IV SOLN: INTRAVENOUS | Status: DC

## 2023-02-13 MED ORDER — SODIUM CHLORIDE 0.9 % IV SOLN: INTRAVENOUS | Status: DC | PRN

## 2023-02-13 MED ORDER — CEFAZOLIN SODIUM-DEXTROSE 2-3 GM-%(50ML) IV SOLR
INTRAVENOUS | Status: DC | PRN
  Administered 2023-02-13: 2 g via INTRAVENOUS

## 2023-02-13 MED ORDER — FENTANYL CITRATE (PF) 250 MCG/5ML IJ SOLN
INTRAMUSCULAR | Status: DC | PRN
  Administered 2023-02-13 (×3): 50 ug via INTRAVENOUS
  Administered 2023-02-13 (×2): 75 ug via INTRAVENOUS

## 2023-02-13 MED ORDER — PANTOPRAZOLE SODIUM 40 MG PO TBEC
40.0000 mg | DELAYED_RELEASE_TABLET | Freq: Every day | ORAL | Status: DC
  Administered 2023-02-14 – 2023-02-16 (×3): 40 mg via ORAL
  Filled 2023-02-13 (×3): qty 1

## 2023-02-13 MED ORDER — HYDROMORPHONE HCL 1 MG/ML IJ SOLN
INTRAMUSCULAR | Status: AC
  Filled 2023-02-13: qty 1

## 2023-02-13 MED ORDER — OXYCODONE HCL 5 MG PO TABS
5.0000 mg | ORAL_TABLET | Freq: Once | ORAL | Status: AC | PRN
  Administered 2023-02-13: 5 mg via ORAL

## 2023-02-13 MED ORDER — DEXAMETHASONE SODIUM PHOSPHATE 10 MG/ML IJ SOLN
INTRAMUSCULAR | Status: AC
  Filled 2023-02-13: qty 1

## 2023-02-13 MED ORDER — CYCLOBENZAPRINE HCL 10 MG PO TABS: 5.0000 mg | ORAL_TABLET | Freq: Three times a day (TID) | ORAL | Status: DC | PRN

## 2023-02-13 MED ORDER — PROPOFOL 10 MG/ML IV BOLUS
INTRAVENOUS | Status: DC | PRN
  Administered 2023-02-13: 160 mg via INTRAVENOUS

## 2023-02-13 MED ORDER — CHLORHEXIDINE GLUCONATE 0.12 % MT SOLN: 15.0000 mL | Freq: Once | OROMUCOSAL | Status: DC

## 2023-02-13 MED ORDER — ORAL CARE MOUTH RINSE: 15.0000 mL | Freq: Once | OROMUCOSAL | Status: DC

## 2023-02-13 MED ORDER — ROCURONIUM BROMIDE 10 MG/ML (PF) SYRINGE
PREFILLED_SYRINGE | INTRAVENOUS | Status: AC
  Filled 2023-02-13: qty 10

## 2023-02-13 MED ORDER — ACETAMINOPHEN 500 MG PO TABS
1000.0000 mg | ORAL_TABLET | Freq: Four times a day (QID) | ORAL | Status: DC
  Administered 2023-02-14 – 2023-02-16 (×11): 1000 mg via ORAL
  Filled 2023-02-13 (×11): qty 2

## 2023-02-13 MED ORDER — FENTANYL CITRATE (PF) 100 MCG/2ML IJ SOLN: 25.0000 ug | INTRAMUSCULAR | Status: DC | PRN

## 2023-02-13 MED ORDER — OXYCODONE HCL 5 MG PO TABS
ORAL_TABLET | ORAL | Status: AC
  Filled 2023-02-13: qty 1

## 2023-02-13 MED ORDER — DEXAMETHASONE SODIUM PHOSPHATE 10 MG/ML IJ SOLN
INTRAMUSCULAR | Status: DC | PRN
  Administered 2023-02-13: 10 mg via INTRAVENOUS

## 2023-02-13 MED ORDER — CYCLOBENZAPRINE HCL 10 MG PO TABS
ORAL_TABLET | ORAL | Status: AC
  Filled 2023-02-13: qty 1

## 2023-02-13 MED ORDER — CHLORHEXIDINE GLUCONATE 0.12 % MT SOLN
OROMUCOSAL | Status: AC
  Filled 2023-02-13: qty 15

## 2023-02-13 MED ORDER — ROCURONIUM BROMIDE 10 MG/ML (PF) SYRINGE
PREFILLED_SYRINGE | INTRAVENOUS | Status: DC | PRN
  Administered 2023-02-13: 30 mg via INTRAVENOUS
  Administered 2023-02-13: 70 mg via INTRAVENOUS

## 2023-02-13 MED ORDER — SENNOSIDES-DOCUSATE SODIUM 8.6-50 MG PO TABS
1.0000 | ORAL_TABLET | Freq: Every day | ORAL | Status: DC
  Administered 2023-02-13 – 2023-02-14 (×2): 1 via ORAL
  Filled 2023-02-13 (×2): qty 1

## 2023-02-13 MED ORDER — FENTANYL CITRATE (PF) 250 MCG/5ML IJ SOLN
INTRAMUSCULAR | Status: AC
  Filled 2023-02-13: qty 5

## 2023-02-13 MED ORDER — ONDANSETRON HCL 4 MG/2ML IJ SOLN
INTRAMUSCULAR | Status: DC | PRN
  Administered 2023-02-13: 4 mg via INTRAVENOUS

## 2023-02-13 MED ORDER — KETOROLAC TROMETHAMINE 30 MG/ML IJ SOLN
30.0000 mg | Freq: Four times a day (QID) | INTRAMUSCULAR | Status: DC
  Administered 2023-02-14 – 2023-02-16 (×11): 30 mg via INTRAVENOUS
  Filled 2023-02-13 (×12): qty 1

## 2023-02-13 MED ORDER — BISACODYL 5 MG PO TBEC
10.0000 mg | DELAYED_RELEASE_TABLET | Freq: Every day | ORAL | Status: DC
  Administered 2023-02-13 – 2023-02-15 (×3): 10 mg via ORAL
  Filled 2023-02-13 (×4): qty 2

## 2023-02-13 MED ORDER — OXYCODONE HCL 5 MG/5ML PO SOLN: 5.0000 mg | Freq: Once | ORAL | Status: AC | PRN

## 2023-02-13 MED ORDER — PROPOFOL 10 MG/ML IV BOLUS
INTRAVENOUS | Status: AC
  Filled 2023-02-13: qty 20

## 2023-02-13 MED ORDER — ACETAMINOPHEN 10 MG/ML IV SOLN
INTRAVENOUS | Status: AC
  Filled 2023-02-13: qty 100

## 2023-02-13 MED ORDER — ACETAMINOPHEN 160 MG/5ML PO SOLN: 1000.0000 mg | Freq: Four times a day (QID) | ORAL | Status: DC

## 2023-02-13 MED ORDER — HYDROMORPHONE HCL 1 MG/ML IJ SOLN
0.5000 mg | INTRAMUSCULAR | Status: AC | PRN
  Administered 2023-02-13 (×4): 0.5 mg via INTRAVENOUS

## 2023-02-13 MED ORDER — CEFAZOLIN SODIUM-DEXTROSE 2-4 GM/100ML-% IV SOLN
2.0000 g | Freq: Three times a day (TID) | INTRAVENOUS | Status: AC
  Administered 2023-02-13 – 2023-02-14 (×2): 2 g via INTRAVENOUS
  Filled 2023-02-13 (×2): qty 100

## 2023-02-13 MED ORDER — CYCLOBENZAPRINE HCL 10 MG PO TABS
5.0000 mg | ORAL_TABLET | Freq: Three times a day (TID) | ORAL | Status: AC | PRN
  Administered 2023-02-13: 5 mg via ORAL

## 2023-02-13 MED ORDER — 0.9 % SODIUM CHLORIDE (POUR BTL) OPTIME
TOPICAL | Status: DC | PRN
  Administered 2023-02-13: 2000 mL

## 2023-02-13 MED ORDER — ONDANSETRON HCL 4 MG/2ML IJ SOLN: 4.0000 mg | Freq: Four times a day (QID) | INTRAMUSCULAR | Status: DC | PRN

## 2023-02-13 MED ORDER — MIDAZOLAM HCL 2 MG/2ML IJ SOLN
INTRAMUSCULAR | Status: AC
  Filled 2023-02-13: qty 2

## 2023-02-13 MED ORDER — AMISULPRIDE (ANTIEMETIC) 5 MG/2ML IV SOLN: 10.0000 mg | Freq: Once | INTRAVENOUS | Status: DC | PRN

## 2023-02-13 MED ORDER — ACETAMINOPHEN 10 MG/ML IV SOLN
1000.0000 mg | Freq: Once | INTRAVENOUS | Status: DC | PRN
  Administered 2023-02-13: 1000 mg via INTRAVENOUS

## 2023-02-13 MED ORDER — BUPIVACAINE LIPOSOME 1.3 % IJ SUSP
INTRAMUSCULAR | Status: AC
  Filled 2023-02-13: qty 20

## 2023-02-13 MED ORDER — BUPIVACAINE LIPOSOME 1.3 % IJ SUSP: INTRAMUSCULAR | Status: DC | PRN

## 2023-02-13 MED ORDER — LIDOCAINE 2% (20 MG/ML) 5 ML SYRINGE
INTRAMUSCULAR | Status: AC
  Filled 2023-02-13: qty 5

## 2023-02-13 MED ORDER — LIDOCAINE 2% (20 MG/ML) 5 ML SYRINGE
INTRAMUSCULAR | Status: DC | PRN
  Administered 2023-02-13: 60 mg via INTRAVENOUS

## 2023-02-13 MED ORDER — LACTATED RINGERS IV SOLN: INTRAVENOUS | Status: DC | PRN

## 2023-02-13 MED ORDER — PROMETHAZINE HCL 25 MG/ML IJ SOLN: 6.2500 mg | INTRAMUSCULAR | Status: DC | PRN

## 2023-02-13 MED ORDER — HEMOSTATIC AGENTS (NO CHARGE) OPTIME
TOPICAL | Status: DC | PRN
  Administered 2023-02-13: 1 via TOPICAL

## 2023-02-13 MED ORDER — ACETAMINOPHEN 160 MG/5ML PO SOLN: 325.0000 mg | ORAL | Status: DC | PRN

## 2023-02-13 MED ORDER — SUGAMMADEX SODIUM 200 MG/2ML IV SOLN
INTRAVENOUS | Status: DC | PRN
  Administered 2023-02-13: 200 mg via INTRAVENOUS

## 2023-02-13 MED ORDER — OXYCODONE HCL 5 MG PO TABS
5.0000 mg | ORAL_TABLET | ORAL | Status: DC | PRN
  Administered 2023-02-13 – 2023-02-14 (×2): 5 mg via ORAL
  Administered 2023-02-14: 10 mg via ORAL
  Administered 2023-02-15 – 2023-02-16 (×2): 5 mg via ORAL
  Filled 2023-02-13: qty 1
  Filled 2023-02-13: qty 2
  Filled 2023-02-13: qty 1
  Filled 2023-02-13: qty 2
  Filled 2023-02-13: qty 1

## 2023-02-13 MED ORDER — ACETAMINOPHEN 325 MG PO TABS: 325.0000 mg | ORAL_TABLET | ORAL | Status: DC | PRN

## 2023-02-13 MED ORDER — MIDAZOLAM HCL 5 MG/5ML IJ SOLN
INTRAMUSCULAR | Status: DC | PRN
  Administered 2023-02-13: 2 mg via INTRAVENOUS

## 2023-02-13 MED ORDER — ONDANSETRON HCL 4 MG/2ML IJ SOLN
INTRAMUSCULAR | Status: AC
  Filled 2023-02-13: qty 2

## 2023-02-13 MED ORDER — BUPIVACAINE HCL (PF) 0.5 % IJ SOLN
INTRAMUSCULAR | Status: AC
  Filled 2023-02-13: qty 30

## 2023-02-13 SURGICAL SUPPLY — 67 items
BLADE CLIPPER SURG (BLADE) ×1 IMPLANT
CANISTER SUCT 3000ML PPV (MISCELLANEOUS) ×2 IMPLANT
CANNULA REDUCER 12-8 DVNC XI (CANNULA) ×2 IMPLANT
CATH THORACIC 28FR (CATHETERS) ×1 IMPLANT
CHLORAPREP W/TINT 26 (MISCELLANEOUS) ×1 IMPLANT
CNTNR URN SCR LID CUP LEK RST (MISCELLANEOUS) ×5 IMPLANT
CONN ST 1/4X3/8 BEN (MISCELLANEOUS) IMPLANT
CONT SPEC 4OZ STRL OR WHT (MISCELLANEOUS) ×5
DEFOGGER SCOPE WARMER CLEARIFY (MISCELLANEOUS) ×1 IMPLANT
DERMABOND ADVANCED .7 DNX12 (GAUZE/BANDAGES/DRESSINGS) ×1 IMPLANT
DRAIN CHANNEL 28F RND 3/8 FF (WOUND CARE) IMPLANT
DRAIN CHANNEL 32F RND 10.7 FF (WOUND CARE) IMPLANT
DRAPE ARM DVNC X/XI (DISPOSABLE) ×4 IMPLANT
DRAPE COLUMN DVNC XI (DISPOSABLE) ×1 IMPLANT
DRAPE CV SPLIT W-CLR ANES SCRN (DRAPES) ×1 IMPLANT
DRAPE HALF SHEET 40X57 (DRAPES) ×1 IMPLANT
DRAPE ORTHO SPLIT 77X108 STRL (DRAPES) ×1
DRAPE SURG ORHT 6 SPLT 77X108 (DRAPES) ×1 IMPLANT
ELECT BLADE 6.5 EXT (BLADE) IMPLANT
ELECT REM PT RETURN 9FT ADLT (ELECTROSURGICAL) ×1
ELECTRODE REM PT RTRN 9FT ADLT (ELECTROSURGICAL) ×1 IMPLANT
FORCEPS CADIERE DVNC XI (FORCEP) IMPLANT
GAUZE KITTNER 4X5 RF (MISCELLANEOUS) ×1 IMPLANT
GAUZE SPONGE 4X4 12PLY STRL (GAUZE/BANDAGES/DRESSINGS) ×1 IMPLANT
GAUZE SPONGE 4X4 12PLY STRL LF (GAUZE/BANDAGES/DRESSINGS) IMPLANT
GLOVE BIO SURGEON STRL SZ7.5 (GLOVE) ×2 IMPLANT
GOWN STRL REUS W/ TWL LRG LVL3 (GOWN DISPOSABLE) ×2 IMPLANT
GOWN STRL REUS W/ TWL XL LVL3 (GOWN DISPOSABLE) ×2 IMPLANT
GOWN STRL REUS W/TWL 2XL LVL3 (GOWN DISPOSABLE) ×1 IMPLANT
GOWN STRL REUS W/TWL LRG LVL3 (GOWN DISPOSABLE) ×2
GOWN STRL REUS W/TWL XL LVL3 (GOWN DISPOSABLE) ×2
GRASPER TIP-UP FEN DVNC XI (INSTRUMENTS) IMPLANT
HEMOSTAT SURGICEL 2X14 (HEMOSTASIS) ×3 IMPLANT
KIT BASIN OR (CUSTOM PROCEDURE TRAY) ×1 IMPLANT
KIT TURNOVER KIT B (KITS) ×1 IMPLANT
NDL 22X1.5 STRL (OR ONLY) (MISCELLANEOUS) ×1 IMPLANT
NEEDLE 22X1.5 STRL (OR ONLY) (MISCELLANEOUS) ×1 IMPLANT
NS IRRIG 1000ML POUR BTL (IV SOLUTION) ×3 IMPLANT
PACK CHEST (CUSTOM PROCEDURE TRAY) ×1 IMPLANT
PAD ARMBOARD 7.5X6 YLW CONV (MISCELLANEOUS) ×5 IMPLANT
RELOAD STAPLE 45 3.5 BLU DVNC (STAPLE) IMPLANT
RELOAD STAPLER 3.5X45 BLU DVNC (STAPLE) ×7 IMPLANT
SEAL UNIV 5-12 XI (MISCELLANEOUS) ×4 IMPLANT
SET TRI-LUMEN FLTR TB AIRSEAL (TUBING) ×1 IMPLANT
SOL ELECTROSURG ANTI STICK (MISCELLANEOUS) ×1
SOLUTION ELECTROSURG ANTI STCK (MISCELLANEOUS) ×1 IMPLANT
STAPLER 45 SUREFORM DVNC (STAPLE) IMPLANT
STAPLER RELOAD 3.5X45 BLU DVNC (STAPLE) ×7
STOPCOCK 4 WAY LG BORE MALE ST (IV SETS) ×1 IMPLANT
SUT SILK 1 MH (SUTURE) ×1 IMPLANT
SUT VIC AB 2-0 CT1 27 (SUTURE) ×1
SUT VIC AB 2-0 CT1 TAPERPNT 27 (SUTURE) ×1 IMPLANT
SUT VIC AB 3-0 SH 27 (SUTURE) ×2
SUT VIC AB 3-0 SH 27X BRD (SUTURE) ×3 IMPLANT
SUT VICRYL 0 TIES 12 18 (SUTURE) ×1 IMPLANT
SUT VICRYL 0 UR6 27IN ABS (SUTURE) ×2 IMPLANT
SYR 10ML LL (SYRINGE) ×1 IMPLANT
SYR 20ML LL LF (SYRINGE) ×1 IMPLANT
SYR 50ML LL SCALE MARK (SYRINGE) ×1 IMPLANT
SYSTEM RETRIEVAL ANCHOR 8 (MISCELLANEOUS) IMPLANT
SYSTEM SAHARA CHEST DRAIN ATS (WOUND CARE) ×1 IMPLANT
TAPE CLOTH 4X10 WHT NS (GAUZE/BANDAGES/DRESSINGS) ×1 IMPLANT
TAPE CLOTH SURG 4X10 WHT LF (GAUZE/BANDAGES/DRESSINGS) IMPLANT
TOWEL GREEN STERILE (TOWEL DISPOSABLE) ×1 IMPLANT
TRAY FOLEY MTR SLVR 16FR STAT (SET/KITS/TRAYS/PACK) ×1 IMPLANT
TUBING EXTENTION W/L.L. (IV SETS) ×1 IMPLANT
WATER STERILE IRR 1000ML POUR (IV SOLUTION) ×1 IMPLANT

## 2023-02-13 NOTE — Discharge Instructions (Signed)
Robot-Assisted Thoracic Surgery, Care After The following information offers guidance on how to care for yourself after your procedure. Your health care provider may also give you more specific instructions. If you have problems or questions, contact your health care provider. What can I expect after the procedure? After the procedure, it is common to have: Some pain and aches in the area of your surgical incisions. Pain when breathing in (inhaling) and coughing. Tiredness (fatigue). Trouble sleeping. Constipation. Follow these instructions at home: Medicines Take over-the-counter and prescription medicines only as told by your health care provider. If you were prescribed an antibiotic medicine, take it as told by your health care provider. Do not stop taking the antibiotic even if you start to feel better. Talk with your health care provider about safe and effective ways to manage pain after your procedure. Pain management should fit your specific health needs. Take pain medicine before pain becomes severe. Relieving and controlling your pain will make breathing easier for you. Ask your health care provider if the medicine prescribed to you requires you to avoid driving or using machinery. Eating and drinking Follow instructions from your health care provider about eating or drinking restrictions. These will vary depending on what procedure you had. Your health care provider may recommend: A liquid diet or soft diet for the first few days. Meals that are smaller and more frequent. A diet of fruits, vegetables, whole grains, and low-fat proteins. Limiting foods that are high in fat and processed sugar, including fried or sweet foods. Incision care Follow instructions from your health care provider about how to take care of your incisions. Make sure you: Wash your hands with soap and water for at least 20 seconds before and after you change your bandage (dressing). If soap and water are not  available, use hand sanitizer. Change your dressing as told by your health care provider. Leave stitches (sutures), skin glue, or adhesive strips in place. These skin closures may need to stay in place for 2 weeks or longer. If adhesive strip edges start to loosen and curl up, you may trim the loose edges. Do not remove adhesive strips completely unless your health care provider tells you to do that. Check your incision area every day for signs of infection. Check for: Redness, swelling, or more pain. Fluid or blood. Warmth. Pus or a bad smell. Activity Return to your normal activities as told by your health care provider. Ask your health care provider what activities are safe for you. Ask your health care provider when it is safe for you to drive. Do not lift anything that is heavier than 10 lb (4.5 kg), or the limit that you are told, until your health care provider says that it is safe. Rest as told by your health care provider. Avoid sitting for a long time without moving. Get up to take short walks every 1-2 hours. This is important to improve blood flow and breathing. Ask for help if you feel weak or unsteady. Do exercises as told by your health care provider. Pneumonia prevention  Do deep breathing exercises and cough regularly as directed. This helps clear mucus and opens your lungs. Doing this helps prevent lung infection (pneumonia). If you were given an incentive spirometer, use it as told. An incentive spirometer is a tool that measures how well you are filling your lungs with each breath. Coughing may hurt less if you try to support your chest. This is called splinting. Try one of these when you  cough: Hold a pillow against your chest. Place the palms of both hands on top of your incision area. Do not use any products that contain nicotine or tobacco. These products include cigarettes, chewing tobacco, and vaping devices, such as e-cigarettes. If you need help quitting, ask your  health care provider. Avoid secondhand smoke. General instructions If you have a drainage tube: Follow instructions from your health care provider about how to take care of it. Do not travel by airplane after your tube is removed until your health care provider tells you it is safe. You may need to take these actions to prevent or treat constipation: Drink enough fluid to keep your urine pale yellow. Take over-the-counter or prescription medicines. Eat foods that are high in fiber, such as beans, whole grains, and fresh fruits and vegetables. Limit foods that are high in fat and processed sugars, such as fried or sweet foods. Keep all follow-up visits. This is important. Contact a health care provider if: You have redness, swelling, or more pain around an incision. You have fluid or blood coming from an incision. An incision feels warm to the touch. You have pus or a bad smell coming from an incision. You have a fever. You cannot eat or drink without vomiting. Your pain medicine is not controlling your pain. Get help right away if: You have chest pain. Your heart is beating quickly. You have trouble breathing. You have trouble speaking. You are confused. You feel weak or dizzy, or you faint. These symptoms may represent a serious problem that is an emergency. Do not wait to see if the symptoms will go away. Get medical help right away. Call your local emergency services (911 in the [place] Do not drive yourself to the hospital. Summary Talk with your health care provider about safe and effective ways to manage pain after your procedure. Pain management should fit your specific health needs. Return to your normal activities as told by your health care provider. Ask your health care provider what activities are safe for you. Do deep breathing exercises and cough regularly as directed. This helps to clear mucus and prevent pneumonia. If it hurts to cough, ease pain by holding a pillow  against your chest or by placing the palms of both hands over your incisions. This information is not intended to replace advice given to you by your health care provider. Make sure you discuss any questions you have with your health care provider. Document Revised: 05/17/2020 Document Reviewed: 05/17/2020 Elsevier Patient Education  2024 [employer].

## 2023-02-13 NOTE — Anesthesia Procedure Notes (Signed)
Procedure Name: Intubation Date/Time: 02/13/2023 9:54 AM  Performed by: Waynard Edwards, CRNAPre-anesthesia Checklist: Patient identified, Emergency Drugs available, Suction available and Patient being monitored Patient Re-evaluated:Patient Re-evaluated prior to induction Oxygen Delivery Method: Circle system utilized Preoxygenation: Pre-oxygenation with 100% oxygen Induction Type: IV induction Ventilation: Mask ventilation without difficulty Laryngoscope Size: Mac and 3 Grade View: Grade I Endobronchial tube: Left, Double lumen EBT, EBT position confirmed by auscultation and EBT position confirmed by fiberoptic bronchoscope and 39 Fr Number of attempts: 1 Airway Equipment and Method: Stylet Placement Confirmation: ETT inserted through vocal cords under direct vision, positive ETCO2 and breath sounds checked- equal and bilateral Secured at: 31 cm Tube secured with: Tape Dental Injury: Teeth and Oropharynx as per pre-operative assessment

## 2023-02-13 NOTE — Brief Op Note (Signed)
02/10/2023 - 02/13/2023  10:07 AM  PATIENT:  Casey Henry  32 y.o. male  PRE-OPERATIVE DIAGNOSIS:  Spontaneous left pneumothorax  POST-OPERATIVE DIAGNOSIS:  Spontaneous left pneumothorax  PROCEDURE:   XI ROBOTIC ASSISTED LEFT THORACOSCOPY,WEDGE RESECTION, PLEURECTOMY, MECHANICAL PLEURODESIS, and INTERCOSTAL NERVE BLOCK  SURGEON:  Surgeon(s) and Role:    Lightfoot, Eliezer Lofts, MD - Primary  PHYSICIAN ASSISTANT: Doree Fudge PA-C  ANESTHESIA:   general  EBL: Miinimal  BLOOD ADMINISTERED:none  DRAINS:  Chest tube placed in the left pleural space    LOCAL MEDICATIONS USED:  OTHER Exparel  SPECIMEN:  Source of Specimen:  Wedge LUL and LLL  DISPOSITION OF SPECIMEN:  PATHOLOGY  COUNTS:  YES  DICTATION: .Dragon Dictation  PLAN OF CARE: Admit to inpatient   PATIENT DISPOSITION:  PACU - hemodynamically stable.   Delay start of Pharmacological VTE agent (>24hrs) due to surgical blood loss or risk of bleeding: no

## 2023-02-13 NOTE — Progress Notes (Signed)
   Casey Henry  ZOX:[id number] DOB: 1991/04/28 DOA: 02/10/2023 PCP: Tally Joe, MD    Brief Narrative:  32 year old with a history of polysubstance abuse, paroxysmal SVT, and anxiety/depression who presented to the ER with left-sided chest pain and shortness of breath.  When attempting to move a heavy piece of furniture approximately 5 days prior to this admission he developed the acute onset of sharp left-sided chest pain with shortness of breath.  He had been taking over-the-counter medications for this pain.  Unfortunately his symptoms progressively got worse as time went on, to the point that he was not able to exert himself even to a minimal extent without becoming severely short of breath.  He presented to the ER where a CXR noted a full collapse of the left lung.  A chest tube was placed while the patient was still in the ER  Consultants:  PCCM  Goals of Care:  Code Status: Full Code   DVT prophylaxis: Lovenox  Interim Hx: Pt in OR today on attempts to evaluate him.   Assessment & Plan:  Spontaneous large volume L pneumothorax - Pulmonary apical blebs B L > R Status post pigtail chest tube placement 6/6 w/ tube management per PCCM - CT chest 6/7 noted small persistent pneumothorax with bilateral left greater than right apical blebs -given finding of blebs and mild intermittent air leak Thoracic Surgery took the patient to the OR for a L RATS wedge resection with mechanical pleurodesis and apical pleurectomy 02/13/23 - postop care per TCTS  Paroxysmal SVT Quiescent at present  Family Communication:  Disposition:    Objective: Blood pressure 117/74, pulse 77, temperature 98.5 F (36.9 C), resp. rate 13, height 6' 0.99" (1.854 m), weight 59.9 kg, SpO2 95 %.  Intake/Output Summary (Last 24 hours) at 02/13/2023 1657 Last data filed at 02/13/2023 1545 Gross per 24 hour  Intake 1220 ml  Output 505 ml  Net 715 ml   Filed Weights   02/10/23 2102 02/13/23 0806  Weight: 59.9 kg 59.9  kg    Examination: No exam by [facility] today - pt under care of TCTS in the OR   CBC: Recent Labs  Lab 02/10/23 2108  WBC 7.1  NEUTROABS 3.6  HGB 15.0  HCT 43.0  MCV 86.5  PLT 214   Basic Metabolic Panel: Recent Labs  Lab 02/10/23 2108  NA 141  K 3.6  CL 105  CO2 27  GLUCOSE 80  BUN 10  CREATININE 0.77  CALCIUM 9.3   GFR: Estimated Creatinine Clearance: 114.4 mL/min (by C-G formula based on SCr of 0.77 mg/dL).   Scheduled Meds:  chlorhexidine  15 mL Mouth/Throat Once   Or   mouth rinse  15 mL Mouth Rinse Once   chlorhexidine       [MAR Hold] clonazePAM  0.5 mg Oral Daily   cyclobenzaprine       [MAR Hold] escitalopram  5 mg Oral Daily   HYDROmorphone       HYDROmorphone       ketorolac  30 mg Intravenous Q6H   oxyCODONE       [MAR Hold] sodium chloride flush  10 mL Intrapleural Q8H     LOS: 1 day   Lonia Blood, MD [facility] Office  [phone] Pager - Text Page per Loretha Stapler  If 7PM-7AM, please contact night-coverage per Amion 02/13/2023, 4:57 PM

## 2023-02-13 NOTE — Progress Notes (Signed)
     [address]       [place] [address]             [phone]       No events  Vitals:   02/12/23 1932 02/13/23 0009  BP: 125/86 116/73  Pulse: (!) 58 (!) 51  Resp: 16 14  Temp: 97.7 F (36.5 C) 97.7 F (36.5 C)  SpO2: 96% 97%   Alert NAD EWOB  OR today for L RATS, wedge resection, mechanical pleurodesis, apical pleurectomy  Tawania Daponte O Drisana Schweickert

## 2023-02-13 NOTE — Anesthesia Postprocedure Evaluation (Addendum)
Anesthesia Post Note  Patient: Casey Henry  Procedure(s) Performed: XI ROBOTIC ASSISTED THORACOSCOPY-WEDGE RESECTION (Left: Chest)     Patient location during evaluation: PACU Anesthesia Type: General Level of consciousness: awake and alert Pain management: pain level controlled Vital Signs Assessment: post-procedure vital signs reviewed and stable Respiratory status: spontaneous breathing, nonlabored ventilation, respiratory function stable and patient connected to nasal cannula oxygen Cardiovascular status: blood pressure returned to baseline and stable Postop Assessment: no apparent nausea or vomiting Anesthetic complications: no  No notable events documented.  Last Vitals:  Vitals:   02/13/23 1545 02/13/23 1645  BP: 115/72 117/74  Pulse: 78 77  Resp: 14 13  Temp:    SpO2: 96% 95%              [name]

## 2023-02-13 NOTE — Anesthesia Preprocedure Evaluation (Addendum)
Anesthesia Evaluation  Patient identified by MRN, date of birth, ID band Patient awake    Reviewed: Allergy & Precautions, NPO status , Patient's Chart, lab work & pertinent test results  Airway Mallampati: I  TM Distance: >3 FB Neck ROM: Full    Dental  (+) Teeth Intact, Dental Advisory Given   Pulmonary former smoker   breath sounds clear to auscultation       Cardiovascular negative cardio ROS  Rhythm:Regular Rate:Normal     Neuro/Psych  PSYCHIATRIC DISORDERS Anxiety Depression       GI/Hepatic ,,,(+)     substance abuse    Endo/Other    Renal/GU      Musculoskeletal negative musculoskeletal ROS (+)    Abdominal   Peds  Hematology negative hematology ROS (+)   Anesthesia Other Findings   Reproductive/Obstetrics                             Anesthesia Physical Anesthesia Plan  ASA: 2  Anesthesia Plan: General   Post-op Pain Management: Tylenol PO (pre-op)* and Toradol IV (intra-op)*   Induction: Intravenous  PONV Risk Score and Plan: 3 and Ondansetron, Dexamethasone, Midazolam and Treatment may vary due to age or medical condition  Airway Management Planned: Double Lumen EBT  Additional Equipment: ClearSight  Intra-op Plan:   Post-operative Plan: Extubation in OR  Informed Consent: I have reviewed the patients History and Physical, chart, labs and discussed the procedure including the risks, benefits and alternatives for the proposed anesthesia with the patient or authorized representative who has indicated his/her understanding and acceptance.       Plan Discussed with: CRNA  Anesthesia Plan Comments:        Anesthesia Quick Evaluation

## 2023-02-13 NOTE — Op Note (Signed)
      [address]       [name] [address]             [phone]        02/13/2023  Patient:  Casey Henry Pre-Op Dx: left spontaneous pneumothorax Bullous emphysema   Post-op Dx:  same Procedure: - Robotic assisted left video thoracoscopy - Wedge resection of the upper and lower lobe - Apical pleurectomy - Mechanical pleurodesis - Intercostal nerve block  Surgeon and Role:      * Kendell Sagraves, Eliezer Lofts, MD - Primary Assistant: Jacques Earthly, PA-C  Anesthesia  general EBL:  10ml Blood Administration: none Specimen:  pleura.  Left upper and lower lobe wedges  Drains: 28 F argyle chest tube in left chest Counts: correct   Indications: This is a 32 year old male with a past medical history of PSVT, anxiety, cocaine abuse, previous cigarette use but now only vaping, and mild ketamine disorder who presented to [facility] ED with complaints of shortness of breath. He was moving furniture this past weekend and he felt a sharp pain on his left side. He denies any trauma or previous episodes. CXR showed large left pneumothorax with complete collapse of the left lung, but no evidence of tension. A left chest tube was placed and follow up chest x ray showed significant re expansion of the left lung (small left apical pneumothorax present). He was transferred to [facility] for further evaluation and treatment. A consultation was obtained with pulmonary/CCM. CT chest done earlier today shows small, left pneumothorax, left greater than right apical blebs, and ground glass opacities of LLL and lingula (likely infectious/inflammatory).   Findings: Apical left upper and lower lobe blebs  Operative Technique: After the risks, benefits and alternatives were thoroughly discussed, the patient was brought to the operative theatre.  Anesthesia was induced, and the patient was then placed in a right lateral decubitus position and was prepped and draped in normal sterile fashion.  An  appropriate surgical pause was performed, and pre-operative antibiotics were dosed accordingly.  We began by placing our 3 robotic ports in the 7th intercostal space targeting the hilum of the lung.  A 12mm assistant port was placed in the 9th intercostal space in the anterior axillary line.  The robot was then docked and all instruments were passed under direct visualization.    The lung was then retracted superiorly, and the inferior pulmonary ligament was divided.  The lung was freed of all pleural adhesions.  Wedge resections of the upper and lower lobes were performed.  The apical parietal pleural was removed with a combination of cautery and blunt dissection.  The chest was irrigated, and an air leak test was performed.  An intercostal nerve block was performed under direct visualization.  A mechanical pleurodesis was then performed.  A 78F chest with then placed, and we watch the remaining lobes re-expand.  The skin and soft tissue were closed with absorbable suture.    The patient tolerated the procedure without any immediate complications, and was transferred to the PACU in stable condition.  Maxamus Colao Keane Scrape

## 2023-02-13 NOTE — Transfer of Care (Signed)
Immediate Anesthesia Transfer of Care Note  Patient: Casey Henry  Procedure(s) Performed: XI ROBOTIC ASSISTED THORACOSCOPY-WEDGE RESECTION (Left: Chest)  Patient Location: PACU  Anesthesia Type:General  Level of Consciousness: drowsy and patient cooperative  Airway & Oxygen Therapy: Patient Spontanous Breathing and Patient connected to face mask oxygen  Post-op Assessment: Report given to RN and Post -op Vital signs reviewed and stable  Post vital signs: Reviewed and stable  Last Vitals:  Vitals Value Taken Time  BP 97/60 02/13/23 1145  Temp    Pulse 58 02/13/23 1145  Resp 15 02/13/23 1145  SpO2 98 % 02/13/23 1145  Vitals shown include unvalidated device data.  Last Pain:  Vitals:   02/13/23 0009  TempSrc: Oral  PainSc:       Patients Stated Pain Goal: 2 (02/12/23 2315)  Complications: No notable events documented.

## 2023-02-14 ENCOUNTER — Encounter (HOSPITAL_COMMUNITY): Payer: Self-pay | Admitting: Internal Medicine

## 2023-02-14 ENCOUNTER — Inpatient Hospital Stay (HOSPITAL_COMMUNITY): Payer: Self-pay

## 2023-02-14 LAB — BASIC METABOLIC PANEL
Anion gap: 10 (ref 5–15)
BUN: 13 mg/dL (ref 6–20)
CO2: 26 mmol/L (ref 22–32)
Calcium: 9 mg/dL (ref 8.9–10.3)
Chloride: 101 mmol/L (ref 98–111)
Creatinine, Ser: 1.05 mg/dL (ref 0.61–1.24)
GFR, Estimated: >60 mL/min (ref >60)
Glucose, Bld: 127 mg/dL — ABNORMAL HIGH (ref 70–99)
Potassium: 4.2 mmol/L (ref 3.5–5.1)
Sodium: 137 mmol/L (ref 135–145)

## 2023-02-14 LAB — CBC
HCT: 37.7 % — ABNORMAL LOW (ref 39.0–52.0)
Hemoglobin: 13.3 g/dL (ref 13.0–17.0)
MCH: 30.9 pg (ref 26.0–34.0)
MCHC: 35.3 g/dL (ref 30.0–36.0)
MCV: 87.5 fL (ref 80.0–100.0)
Platelets: 215 10*3/uL (ref 150–400)
RBC: 4.31 MIL/uL (ref 4.22–5.81)
RDW: 11.7 % (ref 11.5–15.5)
WBC: 12.4 10*3/uL — ABNORMAL HIGH (ref 4.0–10.5)
nRBC: 0 % (ref 0.0–0.2)

## 2023-02-14 MED ORDER — HYDROMORPHONE HCL 1 MG/ML IJ SOLN
0.5000 mg | INTRAMUSCULAR | Status: DC | PRN
  Administered 2023-02-14: 0.5 mg via INTRAVENOUS
  Filled 2023-02-14: qty 0.5

## 2023-02-14 MED ORDER — HYDROMORPHONE HCL 1 MG/ML IJ SOLN: 0.5000 mg | INTRAMUSCULAR | Status: DC | PRN

## 2023-02-14 MED ORDER — CYCLOBENZAPRINE HCL 10 MG PO TABS
5.0000 mg | ORAL_TABLET | Freq: Three times a day (TID) | ORAL | Status: DC
  Administered 2023-02-14 – 2023-02-16 (×7): 5 mg via ORAL
  Filled 2023-02-14 (×6): qty 1

## 2023-02-14 NOTE — Progress Notes (Addendum)
      [address]       [name] [address]             [phone]       1 Day Post-Op Procedure(s) (LRB): XI ROBOTIC ASSISTED THORACOSCOPY-WEDGE RESECTION (Left)  Subjective: Patient did not sleep much. He has pain at left chest/chest tube and posterior left shoulder.  Objective: Vital signs in last 24 hours: Temp:  [97.8 F (36.6 C)-98.6 F (37 C)] 98.6 F (37 C) (06/09 0400) Pulse Rate:  [54-87] 54 (06/09 0400) Cardiac Rhythm: Normal sinus rhythm (06/08 2004) Resp:  [9-18] 15 (06/09 0400) BP: (97-132)/(60-87) 115/75 (06/09 0400) SpO2:  [93 %-98 %] 95 % (06/09 0400) Weight:  [59.9 kg] 59.9 kg (06/08 0806)     Intake/Output from previous day: 06/08 0701 - 06/09 0700 In: 1200 [I.V.:1200] Out: 1621 [Urine:1480; Blood:25; Chest Tube:116]   Physical Exam:  Cardiovascular: RRR Pulmonary: Clear to auscultation bilaterally Abdomen: Soft, non tender, bowel sounds present. Extremities: No lower extremity edema. Wounds: Clean and dry.  No erythema or signs of infection. Chest Tube: to suction, one bubble initially with cough then none  Lab Results: CBC: Recent Labs    02/14/23 0013  WBC 12.4*  HGB 13.3  HCT 37.7*  PLT 215   BMET:  Recent Labs    02/14/23 0013  NA 137  K 4.2  CL 101  CO2 26  GLUCOSE 127*  BUN 13  CREATININE 1.05  CALCIUM 9.0    PT/INR: No results for input(s): "LABPROT", "INR" in the last 72 hours. ABG:  INR: Will add last result for INR, ABG once components are confirmed Will add last 4 CBG results once components are confirmed  Assessment/Plan:  1. CV - SR 2.  Pulmonary - On room air. Chest tube with 116 cc since surgery. Chest tube is to suction, one bubble initially with cough then none. CXR this am appears to show small left apical pneumothorax. Chest tube to remain to suction for now. Check CXR in am.Encourage incentive spirometer.  3. Regarding pain control, Dilaudid IV PRN, schedule Toradol 30 mg IV Q 6,  Oxy IR PRN, and Flexeril 5 mg tid PRN but will schedule tid while in hospital (patient states this helps the most). Heating pad for left posterior shoulder pain  Donielle M ZimmermanPA-C 02/14/2023,7:16 AM  Agree with above Continue CT to suction  Andjela Wickes O Callan Yontz

## 2023-02-14 NOTE — Progress Notes (Signed)
Casey Henry  ZOX:[id number] DOB: 01-22-1991 DOA: 02/10/2023 PCP: Tally Joe, MD    Brief Narrative:  32 year old with a history of polysubstance abuse, paroxysmal SVT, and anxiety/depression who presented to the ER with left-sided chest pain and shortness of breath.  When attempting to move a heavy piece of furniture approximately 5 days prior to this admission he developed the acute onset of sharp left-sided chest pain with shortness of breath.  He had been taking over-the-counter medications for this pain.  Unfortunately his symptoms progressively got worse as time went on, to the point that he was not able to exert himself even to a minimal extent without becoming severely short of breath.  He presented to the ER where a CXR noted a full collapse of the left lung.  A chest tube was placed while the patient was still in the ER  Consultants:  PCCM  Goals of Care:  Code Status: Full Code   DVT prophylaxis: Lovenox  Interim Hx: Appears to have tolerated surgery well yesterday.  Afebrile.  Vital signs stable.  Saturation 97% room air.  Complains of pain at insertion site of chest tube but otherwise has no new complaints and is doing quite well.  Assessment & Plan:  Spontaneous large volume L pneumothorax - Pulmonary apical blebs B L > R Status post pigtail chest tube placement 6/6 w/ tube management per PCCM - CT chest 6/7 noted small persistent pneumothorax with bilateral left greater than right apical blebs -given finding of blebs and mild intermittent air leak Thoracic Surgery took the patient to the OR for a L RATS wedge resection with mechanical pleurodesis and apical pleurectomy 02/13/23 - postop care per TCTS  Paroxysmal SVT Quiescent at present  Family Communication: Spoke with patient and mother at bedside Disposition: Per TCTS   Objective: Blood pressure 118/72, pulse 60, temperature 98 F (36.7 C), temperature source Oral, resp. rate 12, height 6' 0.99" (1.854 m), weight 59.9  kg, SpO2 97 %.  Intake/Output Summary (Last 24 hours) at 02/14/2023 0757 Last data filed at 02/14/2023 0531 Gross per 24 hour  Intake 1200 ml  Output 1621 ml  Net -421 ml    Filed Weights   02/10/23 2102 02/13/23 0806  Weight: 59.9 kg 59.9 kg    Examination: General: No acute respiratory distress Lungs: Clear to auscultation bilaterally Cardiovascular: Regular rate and rhythm without murmur  Abdomen: Nontender, nondistended, soft, bowel sounds positive, no rebound Extremities: No edema bilateral lower extremities   CBC: Recent Labs  Lab 02/10/23 2108 02/14/23 0013  WBC 7.1 12.4*  NEUTROABS 3.6  --   HGB 15.0 13.3  HCT 43.0 37.7*  MCV 86.5 87.5  PLT 214 215    Basic Metabolic Panel: Recent Labs  Lab 02/10/23 2108 02/14/23 0013  NA 141 137  K 3.6 4.2  CL 105 101  CO2 27 26  GLUCOSE 80 127*  BUN 10 13  CREATININE 0.77 1.05  CALCIUM 9.3 9.0    GFR: Estimated Creatinine Clearance: 87.2 mL/min (by C-G formula based on SCr of 1.05 mg/dL).   Scheduled Meds:  acetaminophen  1,000 mg Oral Q6H   Or   acetaminophen (TYLENOL) oral liquid 160 mg/5 mL  1,000 mg Oral Q6H   bisacodyl  10 mg Oral Daily   clonazePAM  0.5 mg Oral Daily   cyclobenzaprine  5 mg Oral TID   escitalopram  5 mg Oral Daily   ketorolac  30 mg Intravenous Q6H   pantoprazole  40 mg Oral  Daily   senna-docusate  1 tablet Oral QHS   sodium chloride flush  10 mL Intrapleural Q8H     LOS: 2 days   Lonia Blood, MD [facility] Office  [phone] Pager - Text Page per Amion  If 7PM-7AM, please contact night-coverage per Amion 02/14/2023, 7:57 AM

## 2023-02-15 ENCOUNTER — Inpatient Hospital Stay (HOSPITAL_COMMUNITY): Payer: Self-pay

## 2023-02-15 LAB — CBC
HCT: 39.7 % (ref 39.0–52.0)
Hemoglobin: 13.6 g/dL (ref 13.0–17.0)
MCH: 31.1 pg (ref 26.0–34.0)
MCHC: 34.3 g/dL (ref 30.0–36.0)
MCV: 90.6 fL (ref 80.0–100.0)
Platelets: 191 10*3/uL (ref 150–400)
RBC: 4.38 MIL/uL (ref 4.22–5.81)
RDW: 11.9 % (ref 11.5–15.5)
WBC: 9.4 10*3/uL (ref 4.0–10.5)
nRBC: 0 % (ref 0.0–0.2)

## 2023-02-15 LAB — COMPREHENSIVE METABOLIC PANEL
ALT: 10 U/L (ref 0–44)
AST: 15 U/L (ref 15–41)
Albumin: 3.2 g/dL — ABNORMAL LOW (ref 3.5–5.0)
Alkaline Phosphatase: 40 U/L (ref 38–126)
Anion gap: 7 (ref 5–15)
BUN: 16 mg/dL (ref 6–20)
CO2: 28 mmol/L (ref 22–32)
Calcium: 8.6 mg/dL — ABNORMAL LOW (ref 8.9–10.3)
Chloride: 105 mmol/L (ref 98–111)
Creatinine, Ser: 0.99 mg/dL (ref 0.61–1.24)
GFR, Estimated: >60 mL/min (ref >60)
Glucose, Bld: 91 mg/dL (ref 70–99)
Potassium: 3.9 mmol/L (ref 3.5–5.1)
Sodium: 140 mmol/L (ref 135–145)
Total Bilirubin: 0.6 mg/dL (ref 0.3–1.2)
Total Protein: 5.5 g/dL — ABNORMAL LOW (ref 6.5–8.1)

## 2023-02-15 NOTE — Progress Notes (Signed)
Casey Henry  ZOX:[id number] DOB: 1991/05/18 DOA: 02/10/2023 PCP: Tally Joe, MD    Brief Narrative:  32 year old with a history of polysubstance abuse, paroxysmal SVT, and anxiety/depression who presented to the ER with left-sided chest pain and shortness of breath.  When attempting to move a heavy piece of furniture approximately 5 days prior to this admission he developed the acute onset of sharp left-sided chest pain with shortness of breath.  He had been taking over-the-counter medications for this pain.  Unfortunately his symptoms progressively got worse as time went on, to the point that he was not able to exert himself even to a minimal extent without becoming severely short of breath.  He presented to the ER where a CXR noted a full collapse of the left lung.  A chest tube was placed while the patient was still in the ER  Consultants:  PCCM  Goals of Care:  Code Status: Full Code   DVT prophylaxis: Lovenox  Interim Hx: No acute events recorded overnight.  Afebrile.  Vital signs stable. No visit from [facility] today as TCTS is overseeing all active issues at present.   Assessment & Plan:  Spontaneous large volume L pneumothorax - Pulmonary apical blebs B L > R Status post pigtail chest tube placement 6/6 w/ tube management per PCCM - CT chest 6/7 noted small persistent pneumothorax with bilateral left greater than right apical blebs -given finding of blebs and mild intermittent air leak Thoracic Surgery took the patient to the OR for a L RATS wedge resection with mechanical pleurodesis and apical pleurectomy 02/13/23 - postop care per TCTS  Paroxysmal SVT Quiescent at present  Family Communication:  Disposition: Per TCTS   Objective: Blood pressure 111/71, pulse 62, temperature 97.7 F (36.5 C), temperature source Oral, resp. rate 13, height 6' 0.99" (1.854 m), weight 59.9 kg, SpO2 97 %.  Intake/Output Summary (Last 24 hours) at 02/15/2023 0803 Last data filed at 02/15/2023  0700 Gross per 24 hour  Intake 306.47 ml  Output 90 ml  Net 216.47 ml    Filed Weights   02/10/23 2102 02/13/23 0806  Weight: 59.9 kg 59.9 kg    Examination: No visit from [facility] today as TCTS is overseeing all active issues at present.    CBC: Recent Labs  Lab 02/10/23 2108 02/14/23 0013 02/15/23 0019  WBC 7.1 12.4* 9.4  NEUTROABS 3.6  --   --   HGB 15.0 13.3 13.6  HCT 43.0 37.7* 39.7  MCV 86.5 87.5 90.6  PLT 214 215 191    Basic Metabolic Panel: Recent Labs  Lab 02/10/23 2108 02/14/23 0013 02/15/23 0019  NA 141 137 140  K 3.6 4.2 3.9  CL 105 101 105  CO2 27 26 28   GLUCOSE 80 127* 91  BUN 10 13 16   CREATININE 0.77 1.05 0.99  CALCIUM 9.3 9.0 8.6*    GFR: Estimated Creatinine Clearance: 92.4 mL/min (by C-G formula based on SCr of 0.99 mg/dL).   Scheduled Meds:  acetaminophen  1,000 mg Oral Q6H   Or   acetaminophen (TYLENOL) oral liquid 160 mg/5 mL  1,000 mg Oral Q6H   bisacodyl  10 mg Oral Daily   clonazePAM  0.5 mg Oral Daily   cyclobenzaprine  5 mg Oral TID   escitalopram  5 mg Oral Daily   ketorolac  30 mg Intravenous Q6H   pantoprazole  40 mg Oral Daily   senna-docusate  1 tablet Oral QHS   sodium chloride flush  10 mL  Intrapleural Q8H     LOS: 3 days   Lonia Blood, MD [facility] Office  [phone] Pager - Text Page per Amion  If 7PM-7AM, please contact night-coverage per Amion 02/15/2023, 8:03 AM

## 2023-02-15 NOTE — Progress Notes (Addendum)
      [address]       [name] [address]             [phone]      2 Days Post-Op Procedure(s) (LRB): XI ROBOTIC ASSISTED THORACOSCOPY-WEDGE RESECTION (Left)  Subjective:  Patient with pain at chest tube site.. Otherwise doing well.  Objective: Vital signs in last 24 hours: Temp:  [97.8 F (36.6 C)-98.1 F (36.7 C)] 97.8 F (36.6 C) (06/10 0430) Pulse Rate:  [59-66] 59 (06/10 0430) Cardiac Rhythm: Normal sinus rhythm (06/10 0430) Resp:  [15-20] 19 (06/10 0430) BP: (95-133)/(48-77) 96/56 (06/10 0430) SpO2:  [92 %-97 %] 92 % (06/10 0430)  Intake/Output from previous day: 06/09 0701 - 06/10 0700 In: 306.5 [P.O.:300; I.V.:6.5] Out: 23 [Chest Tube:54]  General appearance: alert, cooperative, and no distress Heart: regular rate and rhythm Lungs: clear to auscultation bilaterally Abdomen: soft, non-tender; bowel sounds normal; no masses,  no organomegaly Extremities: extremities normal, atraumatic, no cyanosis or edema Wound: clean and dry  Lab Results: Recent Labs    02/14/23 0013 02/15/23 0019  WBC 12.4* 9.4  HGB 13.3 13.6  HCT 37.7* 39.7  PLT 215 191   BMET:  Recent Labs    02/14/23 0013 02/15/23 0019  NA 137 140  K 4.2 3.9  CL 101 105  CO2 26 28  GLUCOSE 127* 91  BUN 13 16  CREATININE 1.05 0.99  CALCIUM 9.0 8.6*    PT/INR: No results for input(s): "LABPROT", "INR" in the last 72 hours. ABG    Component Value Date/Time   TCO2 21 09/05/2016 1353   CBG (last 3)  No results for input(s): "GLUCAP" in the last 72 hours.  Assessment/Plan: S/P Procedure(s) (LRB): XI ROBOTIC ASSISTED THORACOSCOPY-WEDGE RESECTION (Left)  Spontaneous Pneumothorax with persistent air leak, S/P Robotic with bleb resection, mechanical pleurodesis, pleurectomy- no air leak, low output.Marland Kitchen leave chest tube on suction today to complete 48 hours,, will place on water seal @ midnight... repeat CXR in AM   Care per medicine   LOS: 3 days    Lowella Dandy, PA-C 02/15/2023   Chart reviewed, patient examined, agree with above. Feels ok. Ambulated today and noticed some upper chest pain but walked through it. No air leak present. Plan to put to water seal at [place].

## 2023-02-16 ENCOUNTER — Other Ambulatory Visit (HOSPITAL_COMMUNITY): Payer: Self-pay

## 2023-02-16 ENCOUNTER — Inpatient Hospital Stay (HOSPITAL_COMMUNITY): Payer: Self-pay

## 2023-02-16 LAB — SURGICAL PATHOLOGY

## 2023-02-16 MED ORDER — CYCLOBENZAPRINE HCL 5 MG PO TABS
5.0000 mg | ORAL_TABLET | Freq: Three times a day (TID) | ORAL | 0 refills | Status: AC | PRN
  Filled 2023-02-16: qty 30, 10d supply, fill #0

## 2023-02-16 MED ORDER — ACETAMINOPHEN 325 MG PO TABS: 650.0000 mg | ORAL_TABLET | Freq: Four times a day (QID) | ORAL | Status: DC | PRN

## 2023-02-16 MED ORDER — OXYCODONE HCL 5 MG PO TABS
5.0000 mg | ORAL_TABLET | ORAL | 0 refills | Status: DC | PRN
  Filled 2023-02-16: qty 30, 3d supply, fill #0

## 2023-02-16 MED ORDER — BISACODYL 5 MG PO TBEC: 10.0000 mg | DELAYED_RELEASE_TABLET | Freq: Every day | ORAL | 0 refills | Status: AC

## 2023-02-16 NOTE — TOC Transition Note (Addendum)
Transition of Care [facility]) - CM/SW Discharge Note   Patient Details  Name: Casey Henry MRN: [id number] Date of Birth: 1991-05-25  Transition of Care [facility]) CM/SW Contact:  Tom-Johnson, Hershal Coria, RN Phone Number: 02/16/2023, 12:47 PM   Clinical Narrative:     Patient is scheduled for discharge today.  Readmission Risk Assessment done. Outpatient referral, hospital f/u and discharge instructions on AVS. Prescriptions sent to [facility] pharmacy and meds will be delivered to patient at bedside prior discharge. No MATCH needed per [facility]. Family to transport at discharge.  No further TOC needs noted.         Final next level of care: Home/Self Care Barriers to Discharge: Barriers Resolved   Patient Goals and CMS Choice CMS Medicare.gov Compare Post Acute Care list provided to:: Patient Choice offered to / list presented to : NA  Discharge Placement                  Patient to be transferred to facility by: Family      Discharge Plan and Services Additional resources added to the After Visit Summary for                  DME Arranged: N/A DME Agency: NA       HH Arranged: NA HH Agency: NA        Social Determinants of Health (SDOH) Interventions SDOH Screenings   Tobacco Use: Medium Risk (02/14/2023)     Readmission Risk Interventions    02/16/2023   12:45 PM  Readmission Risk Prevention Plan  Post Dischage Appt Complete  Medication Screening Complete  Transportation Screening Complete

## 2023-02-16 NOTE — Progress Notes (Signed)
      [address]       [name] [address]             [phone]        Patient's follow up CXR after chest tube removal has been removed.  Apical space appears stable.  Follow up information has been placed in chart.  We will sign off.  Lowella Dandy, PA-C 12:10 PM 02/16/23

## 2023-02-16 NOTE — Progress Notes (Signed)
Patient's chest tube was placed on water seal, and instructions were given to patient to report any SOB and painful inspirations. Patient stated he understood and will report any changes.

## 2023-02-16 NOTE — Discharge Summary (Signed)
DISCHARGE SUMMARY  Casey Henry  MR#: [id number]  DOB:1991/05/30  Date of Admission: 02/10/2023 Date of Discharge: 02/16/2023  Attending Physician:Ninamarie Keel Silvestre Gunner, MD  Patient's WUJ:[id number], [name], MD  Consults: PCCM TCTS  Disposition: D/C home  Follow-up Appts:  Follow-up Information     Corliss Skains, MD Follow up on 02/25/2023.   Specialty: Cardiothoracic Surgery Why: Appointment is at 1:00 Contact information: [address] [place] [address] [phone]                 Discharge Diagnoses: Spontaneous large volume L pneumothorax Pulmonary apical blebs B L > R Paroxysmal SVT  Initial presentation: 32 year old with a history of polysubstance abuse, paroxysmal SVT, and anxiety/depression who presented to the ER with left-sided chest pain and shortness of breath. When attempting to move a heavy piece of furniture approximately 5 days prior to this admission he developed the acute onset of sharp left-sided chest pain with shortness of breath. He had been taking over-the-counter medications for this pain. Unfortunately his symptoms progressively got worse as time went on, to the point that he was not able to exert himself even to a minimal extent without becoming severely short of breath. He presented to the ER where a CXR noted a full collapse of the left lung. A chest tube was placed while the patient was still in the ER   Hospital Course:  Spontaneous large volume L pneumothorax - Pulmonary apical blebs B L > R Status post pigtail chest tube placement 6/6 w/ tube management per PCCM - CT chest 6/7 noted small persistent pneumothorax with bilateral L > R apical blebs -given finding of blebs and mild intermittent air leak Thoracic Surgery took the patient to the OR for a L RATS wedge resection with mechanical pleurodesis and apical pleurectomy 02/13/23 - postop care per TCTS - chest tube removed 6/11 AM and pt cleared for d/c home by TCTS - to  f/u in TCTS office w/ care and precautions given by TCTS   Paroxysmal SVT Quiescent during this admission   Allergies as of 02/16/2023       Reactions   Duragesic-100 [fentanyl] Palpitations   Pt reports hx of tachycarda w/ runs of V-tach        Medication List     TAKE these medications    acetaminophen 325 MG tablet Commonly known as: TYLENOL Take 2 tablets (650 mg total) by mouth every 6 (six) hours as needed for mild pain, fever or headache.   bisacodyl 5 MG EC tablet Commonly known as: DULCOLAX Take 2 tablets (10 mg total) by mouth daily. Start taking on: February 17, 2023   cyclobenzaprine 5 MG tablet Commonly known as: FLEXERIL Take 1 tablet (5 mg total) by mouth 3 (three) times daily as needed for muscle spasms.   oxyCODONE 5 MG immediate release tablet Commonly known as: Oxy IR/ROXICODONE Take 1-2 tablets (5-10 mg total) by mouth every 4 (four) hours as needed for moderate pain.        Day of Discharge BP 107/61 (BP Location: Right Arm)   Pulse (!) 54   Temp 97.7 F (36.5 C) (Oral)   Resp 15   Ht 6' 0.99" (1.854 m)   Wt 59.9 kg   SpO2 97%   BMI 17.42 kg/m   Physical Exam: General: No acute respiratory distress Lungs: Clear to auscultation bilaterally without wheezes or crackles Cardiovascular: Regular rate and rhythm without murmur  Abdomen: Nontender, nondistended, soft, bowel sounds positive,  no rebound, no ascites, no appreciable mass Extremities: No significant cyanosis, clubbing, or edema bilateral lower extremities  Basic Metabolic Panel: Recent Labs  Lab 02/10/23 2108 02/14/23 0013 02/15/23 0019  NA 141 137 140  K 3.6 4.2 3.9  CL 105 101 105  CO2 27 26 28   GLUCOSE 80 127* 91  BUN 10 13 16   CREATININE 0.77 1.05 0.99  CALCIUM 9.3 9.0 8.6*   CBC: Recent Labs  Lab 02/10/23 2108 02/14/23 0013 02/15/23 0019  WBC 7.1 12.4* 9.4  NEUTROABS 3.6  --   --   HGB 15.0 13.3 13.6  HCT 43.0 37.7* 39.7  MCV 86.5 87.5 90.6  PLT 214 215 191     Time spent in discharge (includes decision making & examination of pt): 35 minutes  02/16/2023, 12:29 PM   Lonia Blood, MD [facility] Office  [phone]

## 2023-02-16 NOTE — Plan of Care (Signed)
Problem: Education: Goal: Knowledge of General Education information will improve Description: Including pain rating scale, medication(s)/side effects and non-pharmacologic comfort measures 02/16/2023 1309 by Carie Caddy, RN Outcome: Adequate for Discharge 02/16/2023 1309 by Carie Caddy, RN Outcome: Adequate for Discharge   Problem: Health Behavior/Discharge Planning: Goal: Ability to manage health-related needs will improve 02/16/2023 1309 by Carie Caddy, RN Outcome: Adequate for Discharge 02/16/2023 1309 by Carie Caddy, RN Outcome: Adequate for Discharge   Problem: Clinical Measurements: Goal: Ability to maintain clinical measurements within normal limits will improve 02/16/2023 1309 by Carie Caddy, RN Outcome: Adequate for Discharge 02/16/2023 1309 by Carie Caddy, RN Outcome: Adequate for Discharge Goal: Will remain free from infection 02/16/2023 1309 by Carie Caddy, RN Outcome: Adequate for Discharge 02/16/2023 1309 by Carie Caddy, RN Outcome: Adequate for Discharge Goal: Diagnostic test results will improve 02/16/2023 1309 by Carie Caddy, RN Outcome: Adequate for Discharge 02/16/2023 1309 by Carie Caddy, RN Outcome: Adequate for Discharge Goal: Respiratory complications will improve 02/16/2023 1309 by Carie Caddy, RN Outcome: Adequate for Discharge 02/16/2023 1309 by Carie Caddy, RN Outcome: Adequate for Discharge Goal: Cardiovascular complication will be avoided 02/16/2023 1309 by Carie Caddy, RN Outcome: Adequate for Discharge 02/16/2023 1309 by Carie Caddy, RN Outcome: Adequate for Discharge   Problem: Activity: Goal: Risk for activity intolerance will decrease 02/16/2023 1309 by Carie Caddy, RN Outcome: Adequate for Discharge 02/16/2023 1309 by Carie Caddy, RN Outcome: Adequate for Discharge   Problem: Nutrition: Goal: Adequate  nutrition will be maintained 02/16/2023 1309 by Carie Caddy, RN Outcome: Adequate for Discharge 02/16/2023 1309 by Carie Caddy, RN Outcome: Adequate for Discharge   Problem: Coping: Goal: Level of anxiety will decrease 02/16/2023 1309 by Carie Caddy, RN Outcome: Adequate for Discharge 02/16/2023 1309 by Carie Caddy, RN Outcome: Adequate for Discharge   Problem: Elimination: Goal: Will not experience complications related to bowel motility 02/16/2023 1309 by Carie Caddy, RN Outcome: Adequate for Discharge 02/16/2023 1309 by Carie Caddy, RN Outcome: Adequate for Discharge Goal: Will not experience complications related to urinary retention 02/16/2023 1309 by Carie Caddy, RN Outcome: Adequate for Discharge 02/16/2023 1309 by Carie Caddy, RN Outcome: Adequate for Discharge   Problem: Pain Managment: Goal: General experience of comfort will improve 02/16/2023 1309 by Carie Caddy, RN Outcome: Adequate for Discharge 02/16/2023 1309 by Carie Caddy, RN Outcome: Adequate for Discharge   Problem: Safety: Goal: Ability to remain free from injury will improve 02/16/2023 1309 by Carie Caddy, RN Outcome: Adequate for Discharge 02/16/2023 1309 by Carie Caddy, RN Outcome: Adequate for Discharge   Problem: Skin Integrity: Goal: Risk for impaired skin integrity will decrease 02/16/2023 1309 by Carie Caddy, RN Outcome: Adequate for Discharge 02/16/2023 1309 by Carie Caddy, RN Outcome: Adequate for Discharge   Problem: Education: Goal: Knowledge of disease or condition will improve 02/16/2023 1309 by Carie Caddy, RN Outcome: Adequate for Discharge 02/16/2023 1309 by Carie Caddy, RN Outcome: Adequate for Discharge Goal: Knowledge of the prescribed therapeutic regimen will improve 02/16/2023 1309 by Carie Caddy, RN Outcome: Adequate for  Discharge 02/16/2023 1309 by Carie Caddy, RN Outcome: Adequate for Discharge   Problem: Activity: Goal: Risk for activity intolerance will decrease 02/16/2023 1309 by Carie Caddy, RN Outcome: Adequate for Discharge 02/16/2023 1309 by Carie Caddy, RN Outcome: Adequate for Discharge   Problem: Cardiac: Goal:  Will achieve and/or maintain hemodynamic stability 02/16/2023 1309 by Carie Caddy, RN Outcome: Adequate for Discharge 02/16/2023 1309 by Carie Caddy, RN Outcome: Adequate for Discharge   Problem: Clinical Measurements: Goal: Postoperative complications will be avoided or minimized 02/16/2023 1309 by Carie Caddy, RN Outcome: Adequate for Discharge 02/16/2023 1309 by Carie Caddy, RN Outcome: Adequate for Discharge   Problem: Respiratory: Goal: Respiratory status will improve 02/16/2023 1309 by Carie Caddy, RN Outcome: Adequate for Discharge 02/16/2023 1309 by Carie Caddy, RN Outcome: Adequate for Discharge   Problem: Pain Management: Goal: Pain level will decrease 02/16/2023 1309 by Carie Caddy, RN Outcome: Adequate for Discharge 02/16/2023 1309 by Carie Caddy, RN Outcome: Adequate for Discharge   Problem: Skin Integrity: Goal: Wound healing without signs and symptoms infection will improve 02/16/2023 1309 by Carie Caddy, RN Outcome: Adequate for Discharge 02/16/2023 1309 by Carie Caddy, RN Outcome: Adequate for Discharge

## 2023-02-16 NOTE — Progress Notes (Addendum)
      [address]       [name] [address]             [phone]      3 Days Post-Op Procedure(s) (LRB): XI ROBOTIC ASSISTED THORACOSCOPY-WEDGE RESECTION (Left)  Subjective:  Patient did not sleep well last night.  Continues to have pain at chest tube site.  Objective: Vital signs in last 24 hours: Temp:  [97.7 F (36.5 C)-98 F (36.7 C)] 97.7 F (36.5 C) (06/11 0318) Pulse Rate:  [46-70] 46 (06/11 0318) Cardiac Rhythm: Sinus bradycardia (06/11 0318) Resp:  [13-25] 13 (06/11 0318) BP: (111-131)/(71-84) 123/75 (06/11 0318) SpO2:  [93 %-97 %] 93 % (06/11 0318)   Intake/Output from previous day: 06/10 0701 - 06/11 0700 In: -  Out: 40 [Chest Tube:40]  General appearance: alert, cooperative, and no distress Heart: regular rate and rhythm Lungs: clear to auscultation bilaterally  Lab Results: Recent Labs    02/14/23 0013 02/15/23 0019  WBC 12.4* 9.4  HGB 13.3 13.6  HCT 37.7* 39.7  PLT 215 191   BMET:  Recent Labs    02/14/23 0013 02/15/23 0019  NA 137 140  K 4.2 3.9  CL 101 105  CO2 26 28  GLUCOSE 127* 91  BUN 13 16  CREATININE 1.05 0.99  CALCIUM 9.0 8.6*    PT/INR: No results for input(s): "LABPROT", "INR" in the last 72 hours. ABG    Component Value Date/Time   TCO2 21 09/05/2016 1353   CBG (last 3)  No results for input(s): "GLUCAP" in the last 72 hours.  Assessment/Plan: S/P Procedure(s) (LRB): XI ROBOTIC ASSISTED THORACOSCOPY-WEDGE RESECTION (Left)  Spontaneous Pneumothorax, S/p Robotic VATS with bleb resection, pleurectomy, pleurodesis... CT on water seal overnight w/o air leak.. CXR shows stable trace apical space... will d/c chest tube, repeat CXR @ noon  Care per primary  Erin Barrett, PA-C  LOS: 4 days    Casey Henry 02/16/2023   Chart reviewed, patient examined, agree with above. CXR was fine this am. Tube is out and pain improved. Will repeat CXR at noon and if ok he can go home when ok with primary team.

## 2023-02-22 NOTE — Progress Notes (Unsigned)
      [address]       [name] [address]             [phone]       HPI: Patient returns for routine postoperative follow-up having undergone S/P Robotic Assisted Left VATS with wedge resection left upper and lower lobes, mechanical pleurodesis, and apical pleurectomy.  He presents today for hospital follow up.   Current Outpatient Medications  Medication Sig Dispense Refill   acetaminophen (TYLENOL) 325 MG tablet Take 2 tablets (650 mg total) by mouth every 6 (six) hours as needed for mild pain, fever or headache.     bisacodyl (DULCOLAX) 5 MG EC tablet Take 2 tablets (10 mg total) by mouth daily. 30 tablet 0   cyclobenzaprine (FLEXERIL) 5 MG tablet Take 1 tablet (5 mg total) by mouth 3 (three) times daily as needed for muscle spasms. 30 tablet 0   oxyCODONE (OXY IR/ROXICODONE) 5 MG immediate release tablet Take 1-2 tablets (5-10 mg total) by mouth every 4 (four) hours as needed for moderate pain. 30 tablet 0   No current facility-administered medications for this visit.    Physical Exam: ***  Diagnostic Tests: ***  Pathology:  SURGICAL PATHOLOGY  CASE: [id number]  PATIENT: Casey Henry  Surgical Pathology Report   Clinical History: spontaneous pneumothorax (cm)   FINAL MICROSCOPIC DIAGNOSIS:   A. PLEURA, PEEL:  - Benign unremarkable pleura   B. LUNG, LEFT LOWER LOBE, WEDGE RESECTION:  - Benign lung parenchyma with multiple subpleural blebs, up to 1 cm   C. LUNG, LEFT UPPER LOBE, WEDGE RESECTION:  - Benign lung parenchyma with multiple subpleural blebs, up to 0.8 cm   A/P:  S/P Robotic Assisted Video Assisted Thoracoscopy with wedge resection Left Upper and Lower lobe  Lowella Dandy, PA-C [facility] [phone]

## 2023-02-25 ENCOUNTER — Ambulatory Visit (INDEPENDENT_AMBULATORY_CARE_PROVIDER_SITE_OTHER): Payer: Self-pay | Admitting: Physician Assistant

## 2023-02-25 ENCOUNTER — Encounter: Payer: Self-pay | Admitting: Physician Assistant

## 2023-02-25 VITALS — BP 106/67 | HR 72 | Resp 20 | Ht 72.0 in | Wt 132.0 lb

## 2023-02-25 DIAGNOSIS — Z09 Encounter for follow-up examination after completed treatment for conditions other than malignant neoplasm: Secondary | ICD-10-CM

## 2023-06-27 ENCOUNTER — Emergency Department (HOSPITAL_BASED_OUTPATIENT_CLINIC_OR_DEPARTMENT_OTHER)
Admission: EM | Admit: 2023-06-27 | Discharge: 2023-06-27 | Disposition: A | Discharge status: Home / Self Care | Payer: Self-pay | Attending: Emergency Medicine | Admitting: Emergency Medicine

## 2023-06-27 ENCOUNTER — Encounter (HOSPITAL_BASED_OUTPATIENT_CLINIC_OR_DEPARTMENT_OTHER): Payer: Self-pay | Admitting: Emergency Medicine

## 2023-06-27 ENCOUNTER — Other Ambulatory Visit: Payer: Self-pay

## 2023-06-27 DIAGNOSIS — K047 Periapical abscess without sinus: Secondary | ICD-10-CM | POA: Insufficient documentation

## 2023-06-27 MED ORDER — KETOROLAC TROMETHAMINE 15 MG/ML IJ SOLN
15.0000 mg | Freq: Once | INTRAMUSCULAR | Status: AC
  Administered 2023-06-27: 15 mg via INTRAMUSCULAR
  Filled 2023-06-27: qty 1

## 2023-06-27 MED ORDER — AMOXICILLIN-POT CLAVULANATE 875-125 MG PO TABS: 1.0000 | ORAL_TABLET | Freq: Two times a day (BID) | ORAL | 0 refills | Status: DC

## 2023-06-27 MED ORDER — OXYCODONE HCL 5 MG PO TABS: 5.0000 mg | ORAL_TABLET | Freq: Four times a day (QID) | ORAL | 0 refills | Status: DC | PRN

## 2023-06-27 NOTE — ED Notes (Signed)
Discharge instructions, follow up care, and prescriptions reviewed and explained, pt verbalized understanding and had no further questions on d/c.  

## 2023-06-27 NOTE — Discharge Instructions (Addendum)
I have sent antibiotic into the pharmacy for you as well as short course of pain medicine.  For any concerning symptoms return to the emergency department otherwise follow-up with the on-call dentist.

## 2023-06-27 NOTE — ED Provider Notes (Signed)
[place]  EMERGENCY DEPARTMENT AT [facility] Provider Note   CSN: [id number] Arrival date & time: 06/27/23  1458     History  Chief Complaint  Patient presents with   Headache   Dental Pain    Casey Henry is a 32 y.o. male.  32 year old male presents today for right-sided dental pain that has been ongoing for the past 4 days.  Today he noticed some facial swelling.  Has taken Tylenol prior to arrival without significant improvement in pain.  No difficulty swallowing or fever, denies drainage.  The history is provided by the patient. No language interpreter was used.       Home Medications Prior to Admission medications   Medication Sig Start Date End Date Taking? Authorizing Provider  amoxicillin-clavulanate (AUGMENTIN) 875-125 MG tablet Take 1 tablet by mouth every 12 (twelve) hours. 06/27/23  Yes Daviel Allegretto, PA-C  oxyCODONE (ROXICODONE) 5 MG immediate release tablet Take 1 tablet (5 mg total) by mouth every 6 (six) hours as needed for severe pain (pain score 7-10). 06/27/23  Yes Karie Mainland, Ervie Mccard, PA-C  acetaminophen (TYLENOL) 325 MG tablet Take 2 tablets (650 mg total) by mouth every 6 (six) hours as needed for mild pain, fever or headache. 02/16/23   Lonia Blood, MD  bisacodyl (DULCOLAX) 5 MG EC tablet Take 2 tablets (10 mg total) by mouth daily. 02/17/23   Lonia Blood, MD  cyclobenzaprine (FLEXERIL) 5 MG tablet Take 1 tablet (5 mg total) by mouth 3 (three) times daily as needed for muscle spasms. 02/16/23   Lonia Blood, MD      Allergies    Duragesic-100 [fentanyl]    Review of Systems   Review of Systems  Constitutional:  Negative for chills and fever.  HENT:  Positive for dental problem. Negative for drooling, trouble swallowing and voice change.   Respiratory:  Negative for shortness of breath.   All other systems reviewed and are negative.   Physical Exam Updated Vital Signs BP (!) 134/98   Pulse 89   Temp 98.9 F (37.2 C) (Oral)    Resp 16   SpO2 98%  Physical Exam Vitals and nursing note reviewed.  Constitutional:      General: He is not in acute distress.    Appearance: Normal appearance. He is not ill-appearing.  HENT:     Head: Normocephalic and atraumatic.     Nose: Nose normal.     Mouth/Throat:     Comments: No evidence of retropharyngeal abscess, peritonsillar abscess, Ludwig's angina, or trismus.  No drainable abscess noted. Eyes:     Conjunctiva/sclera: Conjunctivae normal.  Pulmonary:     Effort: Pulmonary effort is normal. No respiratory distress.  Musculoskeletal:        General: No deformity.  Skin:    Findings: No rash.  Neurological:     Mental Status: He is alert.     ED Results / Procedures / Treatments   Labs (all labs ordered are listed, but only abnormal results are displayed) Labs Reviewed - No data to display  EKG None  Radiology No results found.  Procedures Procedures    Medications Ordered in ED Medications - No data to display  ED Course/ Medical Decision Making/ A&P                                 Medical Decision Making Risk Prescription drug management.   32 year old male presents today  for concern of facial swelling.  No drainable abscess noted on exam.  No evidence of retropharyngeal abscess, peritonsillar abscess, Ludwig's angina, trismus.  Augmentin prescribed.  Short course of pain medication given.  Toradol given in the ED.  On-call dental information as well as additional dental resource provided.  Patient appropriate for discharge.  Return precautions discussed.   Final Clinical Impression(s) / ED Diagnoses Final diagnoses:  Dental abscess    Rx / DC Orders ED Discharge Orders          Ordered    amoxicillin-clavulanate (AUGMENTIN) 875-125 MG tablet  Every 12 hours        06/27/23 1704    oxyCODONE (ROXICODONE) 5 MG immediate release tablet  Every 6 hours PRN        06/27/23 1704              Marita Kansas, PA-C 06/27/23 1710     Glyn Ade, MD 06/27/23 2016

## 2023-06-27 NOTE — ED Triage Notes (Signed)
Dental pain upper right side, swelling. Tylenol no longer helping

## 2023-08-05 ENCOUNTER — Other Ambulatory Visit: Payer: Self-pay

## 2023-08-05 ENCOUNTER — Emergency Department (HOSPITAL_BASED_OUTPATIENT_CLINIC_OR_DEPARTMENT_OTHER)
Admission: EM | Admit: 2023-08-05 | Discharge: 2023-08-05 | Disposition: A | Discharge status: Home / Self Care | Payer: Self-pay | Attending: Emergency Medicine | Admitting: Emergency Medicine

## 2023-08-05 DIAGNOSIS — K0889 Other specified disorders of teeth and supporting structures: Secondary | ICD-10-CM | POA: Insufficient documentation

## 2023-08-05 MED ORDER — ACETAMINOPHEN 325 MG PO TABS: 650.0000 mg | ORAL_TABLET | Freq: Four times a day (QID) | ORAL | 0 refills | Status: DC | PRN

## 2023-08-05 MED ORDER — CLINDAMYCIN HCL 150 MG PO CAPS
300.0000 mg | ORAL_CAPSULE | Freq: Once | ORAL | Status: AC
  Administered 2023-08-05: 300 mg via ORAL
  Filled 2023-08-05: qty 2

## 2023-08-05 MED ORDER — HYDROMORPHONE HCL 1 MG/ML IJ SOLN
1.0000 mg | Freq: Once | INTRAMUSCULAR | Status: AC
  Administered 2023-08-05: 1 mg via INTRAMUSCULAR
  Filled 2023-08-05: qty 1

## 2023-08-05 MED ORDER — CLINDAMYCIN HCL 300 MG PO CAPS: 300.0000 mg | ORAL_CAPSULE | Freq: Four times a day (QID) | ORAL | 0 refills | Status: AC

## 2023-08-05 MED ORDER — KETOROLAC TROMETHAMINE 60 MG/2ML IM SOLN
30.0000 mg | Freq: Once | INTRAMUSCULAR | Status: AC
  Administered 2023-08-05: 30 mg via INTRAMUSCULAR
  Filled 2023-08-05: qty 2

## 2023-08-05 MED ORDER — NAPROXEN 500 MG PO TABS: 500.0000 mg | ORAL_TABLET | Freq: Two times a day (BID) | ORAL | 0 refills | Status: DC

## 2023-08-05 NOTE — ED Triage Notes (Signed)
Patient presents to ED reporting tooth in back of mouth with "hole in it" that causes extreme migraines. Patient reports he completed his antibiotics for his tooth and has been taking pain medication along with OTC tylenol without any relief. Patient speaking in full complete sentences, airway intact.

## 2023-08-05 NOTE — Discharge Instructions (Signed)
Take the naproxen as recommended above and then you can add tylenol as needed. Do this every day until antibiotics run out. This should help control the pain a little bit until you can work on getting into a dentist for definitive treatment.

## 2023-08-05 NOTE — ED Provider Notes (Signed)
[place] EMERGENCY DEPARTMENT AT [facility] Provider Note   CSN: [id number] Arrival date & time: 08/05/23  0310     History  Chief Complaint  Patient presents with   Migraine    Casey Henry is a 32 y.o. male.  32 yo M who presents the ER tonight secondary to dental pain.  He told registration triage he had a severe migraine however he tells me that he has really bad tooth pain secondary to a rotten tooth with a hole in it that radiates to side of his head causing him to have a bad headache.  He was here for this a couple months ago.  Took his antibiotics and oxycodone states neither 1 helped a bit but the pain came back 4 days ago.  No trouble swallowing, talking or breathing.  No swelling of his face.  No fevers.  No drainage.  Has not seen a dentist or attempted to.  No neurologic just.   Migraine       Home Medications Prior to Admission medications   Medication Sig Start Date End Date Taking? Authorizing Provider  acetaminophen (TYLENOL) 325 MG tablet Take 2 tablets (650 mg total) by mouth every 6 (six) hours as needed. 08/05/23  Yes Murrell Dome, Barbara Cower, MD  clindamycin (CLEOCIN) 300 MG capsule Take 1 capsule (300 mg total) by mouth 4 (four) times daily for 10 days. 08/05/23 08/15/23 Yes Gizel Riedlinger, Barbara Cower, MD  naproxen (NAPROSYN) 500 MG tablet Take 1 tablet (500 mg total) by mouth 2 (two) times daily. 08/05/23  Yes Marina Boerner, Barbara Cower, MD  bisacodyl (DULCOLAX) 5 MG EC tablet Take 2 tablets (10 mg total) by mouth daily. 02/17/23   Lonia Blood, MD  cyclobenzaprine (FLEXERIL) 5 MG tablet Take 1 tablet (5 mg total) by mouth 3 (three) times daily as needed for muscle spasms. 02/16/23   Lonia Blood, MD      Allergies    Duragesic-100 [fentanyl]    Review of Systems   Review of Systems  Physical Exam Updated Vital Signs BP 109/85   Pulse 65   Temp 97.7 F (36.5 C) (Oral)   Resp 16   Ht 6\' 1"  (1.854 m)   Wt 65.8 kg   SpO2 97%   BMI 19.13 kg/m  Physical  Exam Vitals and nursing note reviewed.  Constitutional:      Appearance: He is well-developed.  HENT:     Head: Normocephalic and atraumatic.     Comments: Multiple caries however the tooth that is bothering him is his right lower molar that has a good-sized area of brought into it.  Little erythema of the gum.  No edema, fluctuance or evidence of abscess. No sublingual edema, peritonsillar swelling. Cardiovascular:     Rate and Rhythm: Normal rate.  Pulmonary:     Effort: Pulmonary effort is normal. No respiratory distress.  Abdominal:     General: There is no distension.  Musculoskeletal:        General: Normal range of motion.     Cervical back: Normal range of motion.  Neurological:     Mental Status: He is alert.     ED Results / Procedures / Treatments   Labs (all labs ordered are listed, but only abnormal results are displayed) Labs Reviewed - No data to display  EKG None  Radiology No results found.  Procedures Procedures    Medications Ordered in ED Medications  HYDROmorphone (DILAUDID) injection 1 mg (1 mg Intramuscular Given 08/05/23 0414)  ketorolac (TORADOL)  injection 30 mg (30 mg Intramuscular Given 08/05/23 0414)  clindamycin (CLEOCIN) capsule 300 mg (300 mg Oral Given 08/05/23 0417)    ED Course/ Medical Decision Making/ A&P                                 Medical Decision Making Risk OTC drugs. Prescription drug management.   Patient here with uncomplicated dental pain without any evidence of Ludwig's, abscess, deep neck space infection.  Will try clindamycin and treat his pain here but suggested OTC medications at home.  Once again reiterated that he needed to see a dentist or this would keep reoccurring.  He has multiple other teeth that likely need attention as well.  Return here for any worsening or life-threatening symptoms.  Low suspicion for brain bleed, stroke or other emergent causes for his headache at the gets related to his  tooth.        Final Clinical Impression(s) / ED Diagnoses Final diagnoses:  Pain, dental    Rx / DC Orders ED Discharge Orders          Ordered    clindamycin (CLEOCIN) 300 MG capsule  4 times daily        08/05/23 0435    naproxen (NAPROSYN) 500 MG tablet  2 times daily        08/05/23 0435    acetaminophen (TYLENOL) 325 MG tablet  Every 6 hours PRN        08/05/23 0435              Thomasine Klutts, Barbara Cower, MD 08/05/23 2308

## 2023-12-01 ENCOUNTER — Other Ambulatory Visit: Payer: Self-pay

## 2023-12-01 ENCOUNTER — Encounter (HOSPITAL_COMMUNITY): Payer: Self-pay | Admitting: Emergency Medicine

## 2023-12-01 ENCOUNTER — Emergency Department (HOSPITAL_COMMUNITY)
Admission: EM | Admit: 2023-12-01 | Discharge: 2023-12-01 | Disposition: A | Discharge status: Home / Self Care | Payer: Self-pay | Attending: Emergency Medicine | Admitting: Emergency Medicine

## 2023-12-01 DIAGNOSIS — K0889 Other specified disorders of teeth and supporting structures: Secondary | ICD-10-CM | POA: Insufficient documentation

## 2023-12-01 LAB — COMPREHENSIVE METABOLIC PANEL
ALT: 11 U/L (ref 0–44)
AST: 16 U/L (ref 15–41)
Albumin: 4.1 g/dL (ref 3.5–5.0)
Alkaline Phosphatase: 60 U/L (ref 38–126)
Anion gap: 8 (ref 5–15)
BUN: 15 mg/dL (ref 6–20)
CO2: 25 mmol/L (ref 22–32)
Calcium: 9 mg/dL (ref 8.9–10.3)
Chloride: 106 mmol/L (ref 98–111)
Creatinine, Ser: 0.73 mg/dL (ref 0.61–1.24)
GFR, Estimated: >60 mL/min (ref >60)
Glucose, Bld: 89 mg/dL (ref 70–99)
Potassium: 3.8 mmol/L (ref 3.5–5.1)
Sodium: 139 mmol/L (ref 135–145)
Total Bilirubin: 1.2 mg/dL (ref 0.0–1.2)
Total Protein: 7 g/dL (ref 6.5–8.1)

## 2023-12-01 LAB — CBC WITH DIFFERENTIAL/PLATELET
Abs Immature Granulocytes: 0.02 10*3/uL (ref 0.00–0.07)
Basophils Absolute: 0.1 10*3/uL (ref 0.0–0.1)
Basophils Relative: 1 %
Eosinophils Absolute: 0.2 10*3/uL (ref 0.0–0.5)
Eosinophils Relative: 3 %
HCT: 40 % (ref 39.0–52.0)
Hemoglobin: 13.5 g/dL (ref 13.0–17.0)
Immature Granulocytes: 0 %
Lymphocytes Relative: 28 %
Lymphs Abs: 1.9 10*3/uL (ref 0.7–4.0)
MCH: 30.6 pg (ref 26.0–34.0)
MCHC: 33.8 g/dL (ref 30.0–36.0)
MCV: 90.7 fL (ref 80.0–100.0)
Monocytes Absolute: 0.6 10*3/uL (ref 0.1–1.0)
Monocytes Relative: 8 %
Neutro Abs: 4.1 10*3/uL (ref 1.7–7.7)
Neutrophils Relative %: 60 %
Platelets: 223 10*3/uL (ref 150–400)
RBC: 4.41 MIL/uL (ref 4.22–5.81)
RDW: 12.9 % (ref 11.5–15.5)
WBC: 6.8 10*3/uL (ref 4.0–10.5)
nRBC: 0 % (ref 0.0–0.2)

## 2023-12-01 MED ORDER — CLINDAMYCIN HCL 300 MG PO CAPS: 300.0000 mg | ORAL_CAPSULE | Freq: Three times a day (TID) | ORAL | 0 refills | Status: AC

## 2023-12-01 MED ORDER — HYDROCODONE-ACETAMINOPHEN 5-325 MG PO TABS: 1.0000 | ORAL_TABLET | Freq: Four times a day (QID) | ORAL | 0 refills | Status: DC | PRN

## 2023-12-01 MED ORDER — NAPROXEN 500 MG PO TABS: 500.0000 mg | ORAL_TABLET | Freq: Two times a day (BID) | ORAL | 0 refills | Status: AC

## 2023-12-01 MED ORDER — KETOROLAC TROMETHAMINE 30 MG/ML IJ SOLN
30.0000 mg | Freq: Once | INTRAMUSCULAR | Status: AC
  Administered 2023-12-01: 30 mg via INTRAVENOUS
  Filled 2023-12-01: qty 1

## 2023-12-01 NOTE — Discharge Instructions (Signed)
 Please read and follow all provided instructions.  Your diagnoses today include:  1. Pain, dental    The exam and treatment you received today has been provided on an emergency basis only. This is not a substitute for complete medical or dental care.  Tests performed today include: Vital signs. See below for your results today.   Medications prescribed:  Clindamycin - antibiotic  You have been prescribed an antibiotic medicine: take the entire course of medicine even if you are feeling better. Stopping early can cause the antibiotic not to work.  Naproxen - anti-inflammatory pain medication Do not exceed 500mg  naproxen every 12 hours, take with food  You have been prescribed an anti-inflammatory medication or NSAID. Take with food. Take smallest effective dose for the shortest duration needed for your pain. Stop taking if you experience stomach pain or vomiting.   Vicodin (hydrocodone/acetaminophen) - narcotic pain medication  DO NOT drive or perform any activities that require you to be awake and alert because this medicine can make you drowsy. BE VERY CAREFUL not to take multiple medicines containing Tylenol (also called acetaminophen). Doing so can lead to an overdose which can damage your liver and cause liver failure and possibly death.  Take any prescribed medications only as directed.  Home care instructions:  Follow any educational materials contained in this packet.  Follow-up instructions: Please follow-up with your dentist for further evaluation of your symptoms.   Dental Assistance: See attached dental referral and/or resource guide.   Return instructions:  Please return to the Emergency Department if you experience worsening symptoms. Please return if you develop a fever, you develop more swelling in your face or neck, you have trouble breathing or swallowing food. Please return if you have any other emergent concerns.  Additional Information:  Your vital signs  today were: BP (!) 135/109   Pulse 65   Temp 97.6 F (36.4 C) (Oral)   Resp 18   Wt 70.3 kg   SpO2 99%   BMI 20.45 kg/m  If your blood pressure (BP) was elevated above 135/85 this visit, please have this repeated by your doctor within one month. --------------

## 2023-12-01 NOTE — ED Triage Notes (Signed)
 Patient c/o left lower wisdom tooth pain x 2 days.  Patient states that pain radiates through his jaw up into his ear.  Patient reports he has a dental appointment, but the pain has been unbearable.

## 2023-12-01 NOTE — ED Provider Notes (Signed)
  EMERGENCY DEPARTMENT AT [facility] Provider Note   CSN: [id number] Arrival date & time: 12/01/23  3295     History  Chief Complaint  Patient presents with   Dental Pain    Casey Henry is a 33 y.o. male.  Patient with history of spontaneous pneumothorax, dental pain --presents to the emergency department for evaluation of worsening dental pain, left sided, over the past couple of days.  Patient has had improvement in the past with clindamycin and naproxen.  No facial swelling.  No difficulty breathing or swallowing.  The pain radiates to the left ear area.  States that he has an appointment at a dental clinic in about a week and a half.  Patient states that they took 9 extra-strength Tylenol today for pain.       Home Medications Prior to Admission medications   Medication Sig Start Date End Date Taking? Authorizing Provider  acetaminophen (TYLENOL) 325 MG tablet Take 2 tablets (650 mg total) by mouth every 6 (six) hours as needed. 08/05/23   Mesner, Barbara Cower, MD  bisacodyl (DULCOLAX) 5 MG EC tablet Take 2 tablets (10 mg total) by mouth daily. 02/17/23   Lonia Blood, MD  cyclobenzaprine (FLEXERIL) 5 MG tablet Take 1 tablet (5 mg total) by mouth 3 (three) times daily as needed for muscle spasms. 02/16/23   Lonia Blood, MD  naproxen (NAPROSYN) 500 MG tablet Take 1 tablet (500 mg total) by mouth 2 (two) times daily. 08/05/23   Mesner, Barbara Cower, MD      Allergies    Duragesic-100 [fentanyl]    Review of Systems   Review of Systems  Physical Exam Updated Vital Signs BP (!) 135/109   Pulse 65   Temp 97.6 F (36.4 C) (Oral)   Resp 18   Wt 70.3 kg   SpO2 99%   BMI 20.45 kg/m  Physical Exam Vitals and nursing note reviewed.  Constitutional:      Appearance: He is well-developed.  HENT:     Head: Normocephalic and atraumatic.     Mouth/Throat:     Comments: Patient with very poor dentition.  Teeth #15 and 18 of broken to the gumline.  No gross  dental abscess noted. Eyes:     Conjunctiva/sclera: Conjunctivae normal.  Pulmonary:     Effort: No respiratory distress.  Musculoskeletal:     Cervical back: Normal range of motion and neck supple.  Skin:    General: Skin is warm and dry.  Neurological:     Mental Status: He is alert.     ED Results / Procedures / Treatments   Labs (all labs ordered are listed, but only abnormal results are displayed) Labs Reviewed  CBC WITH DIFFERENTIAL/PLATELET  COMPREHENSIVE METABOLIC PANEL    EKG None  Radiology No results found.  Procedures Procedures    Medications Ordered in ED Medications  ketorolac (TORADOL) 30 MG/ML injection 30 mg (has no administration in time range)    ED Course/ Medical Decision Making/ A&P    Patient seen and examined. History obtained directly from patient.  Reviewed lab work that was ordered in triage.  IV was also placed.  Labs/EKG: CBC unremarkable; CMP pending.  Imaging: None ordered.  Medications/Fluids: Ordered: IV Toradol 30 mg  Most recent vital signs reviewed and are as follows: BP (!) 135/109   Pulse 65   Temp 97.6 F (36.4 C) (Oral)   Resp 18   Wt 70.3 kg   SpO2 99%  BMI 20.45 kg/m   Initial impression: dental pain/dental infection  Plan: Discharge to home.   Prescriptions written for: Clindamycin, naproxen, # 3 tablets Vicodin  Other home care instructions discussed: Avoidance of chewing or other activities that makes the symptoms worse. Eat soft foods if needed and maintain good hydration.   We discussed to avoid Tylenol over the next 24 hours and in the future do not exceed 4 g/day.  ED return instructions discussed: Encouraged patient to return with worsening facial or neck swelling, difficulty breathing or swallowing, fever.   Follow-up instructions discussed: Patient encouraged to follow-up with provided dental referral, resources -- or their own dentist if able.                                 Medical  Decision Making Amount and/or Complexity of Data Reviewed Labs: ordered.  Risk Prescription drug management.   Patient presents for dental pain. They do not have a fever and do not appear septic. Exam unconcerning for Ludwig's angina or other deep tissue infection in neck and I do not feel that advanced imaging is indicated at this time. Low suspicion for PTA, RPA, epiglottis based on exam.   Patient will be treated for dental infection with antibiotic. Encouraged tylenol/NSAIDs as prescribed or as directed on the packaging for pain. Given small amount of narcotic for severe pain.  Encouraged follow-up with a dentist for definitive and long-term management.          Final Clinical Impression(s) / ED Diagnoses Final diagnoses:  Pain, dental    Rx / DC Orders ED Discharge Orders          Ordered    clindamycin (CLEOCIN) 300 MG capsule  3 times daily        12/01/23 0803    naproxen (NAPROSYN) 500 MG tablet  2 times daily        12/01/23 0803    HYDROcodone-acetaminophen (NORCO/VICODIN) 5-325 MG tablet  Every 6 hours PRN        12/01/23 0805              Renne Crigler, PA-C 12/01/23 0805    Alvira Monday, MD 12/02/23 [phone]

## 2024-05-24 ENCOUNTER — Emergency Department (HOSPITAL_BASED_OUTPATIENT_CLINIC_OR_DEPARTMENT_OTHER): Payer: Self-pay

## 2024-05-24 ENCOUNTER — Emergency Department (HOSPITAL_BASED_OUTPATIENT_CLINIC_OR_DEPARTMENT_OTHER)
Admission: EM | Admit: 2024-05-24 | Discharge: 2024-05-24 | Disposition: A | Discharge status: Home / Self Care | Payer: Self-pay | Attending: Emergency Medicine | Admitting: Emergency Medicine

## 2024-05-24 ENCOUNTER — Other Ambulatory Visit: Payer: Self-pay

## 2024-05-24 DIAGNOSIS — N410 Acute prostatitis: Secondary | ICD-10-CM | POA: Insufficient documentation

## 2024-05-24 LAB — HIV ANTIBODY (ROUTINE TESTING W REFLEX): HIV Screen 4th Generation wRfx: NONREACTIVE

## 2024-05-24 LAB — URINALYSIS, ROUTINE W REFLEX MICROSCOPIC
Bacteria, UA: NONE SEEN
Bilirubin Urine: NEGATIVE
Glucose, UA: NEGATIVE mg/dL
Ketones, ur: NEGATIVE mg/dL
Nitrite: NEGATIVE
Protein, ur: 100 mg/dL — AB
RBC / HPF: >50 RBC/hpf (ref 0–5)
Specific Gravity, Urine: 1.024 (ref 1.005–1.030)
WBC, UA: >50 WBC/hpf (ref 0–5)
pH: 6 (ref 5.0–8.0)

## 2024-05-24 MED ORDER — AZITHROMYCIN 250 MG PO TABS
1000.0000 mg | ORAL_TABLET | Freq: Once | ORAL | Status: AC
Start: 2024-05-24 — End: 2024-05-24
  Administered 2024-05-24: 1000 mg via ORAL
  Filled 2024-05-24: qty 4

## 2024-05-24 MED ORDER — KETOROLAC TROMETHAMINE 15 MG/ML IJ SOLN
15.0000 mg | Freq: Once | INTRAMUSCULAR | Status: AC
  Administered 2024-05-24: 15 mg via INTRAVENOUS
  Filled 2024-05-24: qty 1

## 2024-05-24 MED ORDER — KETOROLAC TROMETHAMINE 15 MG/ML IJ SOLN: 15.0000 mg | Freq: Once | INTRAMUSCULAR | Status: DC

## 2024-05-24 MED ORDER — SULFAMETHOXAZOLE-TRIMETHOPRIM 800-160 MG PO TABS
1.0000 | ORAL_TABLET | Freq: Two times a day (BID) | ORAL | 0 refills | Status: AC
Start: 2024-05-24 — End: 2024-06-07

## 2024-05-24 MED ORDER — METRONIDAZOLE 500 MG PO TABS
2000.0000 mg | ORAL_TABLET | Freq: Once | ORAL | Status: AC
  Administered 2024-05-24: 2000 mg via ORAL
  Filled 2024-05-24: qty 4

## 2024-05-24 MED ORDER — CEFTRIAXONE SODIUM 1 G IJ SOLR
1.0000 g | Freq: Once | INTRAMUSCULAR | Status: AC
  Administered 2024-05-24: 1 g via INTRAVENOUS
  Filled 2024-05-24: qty 10

## 2024-05-24 NOTE — ED Triage Notes (Signed)
 C/o dysurai and urinary frequency x 2 days.

## 2024-05-24 NOTE — Discharge Instructions (Signed)
 I am going to start on antibiotics for urinary tract infection that also cover for infection of your prostate.  I did send off studies to try and test you for possible sexually transmitted disease.  If this comes back positive please let your partners know from up to 90 days before today so they could also be tested and treated.

## 2024-05-24 NOTE — ED Notes (Signed)
 Called down to lab to add GC/Chlamydia to urine. Lab stated that it is added

## 2024-05-24 NOTE — ED Provider Notes (Signed)
 [name] EMERGENCY DEPARTMENT AT [facility] Provider Note   CSN: [id number] Arrival date & time: 05/24/24  1209     Patient presents with: Urinary Frequency   Casey Henry is a 33 y.o. male.   33 yo M with a chief complaints of concern for urinary tract infection.  Patient having dysuria increased frequency and hesitancy going on for about 48 hours.  Has been feeling subjectively hot and cold but no measured fevers.  No flank pain.  No nausea or vomiting.  Has never had 1 before.  Denies likelihood of sexually transmitted disease.  Denies anal receptive intercourse.  He does feel like the pain goes down into his perineum.     Urinary Frequency       Prior to Admission medications   Medication Sig Start Date End Date Taking? Authorizing Provider  sulfamethoxazole -trimethoprim  (BACTRIM  DS) 800-160 MG tablet Take 1 tablet by mouth 2 (two) times daily for 14 days. 05/24/24 06/07/24 Yes Emil Share, DO  acetaminophen  (TYLENOL ) 325 MG tablet Take 2 tablets (650 mg total) by mouth every 6 (six) hours as needed. 08/05/23   Mesner, Selinda, MD  bisacodyl  (DULCOLAX) 5 MG EC tablet Take 2 tablets (10 mg total) by mouth daily. 02/17/23   Danton Reyes DASEN, MD  cyclobenzaprine  (FLEXERIL ) 5 MG tablet Take 1 tablet (5 mg total) by mouth 3 (three) times daily as needed for muscle spasms. 02/16/23   Danton Reyes DASEN, MD  HYDROcodone -acetaminophen  (NORCO/VICODIN) 5-325 MG tablet Take 1 tablet by mouth every 6 (six) hours as needed for severe pain (pain score 7-10). 12/01/23   Desiderio Chew, PA-C  naproxen  (NAPROSYN ) 500 MG tablet Take 1 tablet (500 mg total) by mouth 2 (two) times daily. 12/01/23   Desiderio Chew, PA-C    Allergies: Duragesic -100 [fentanyl ]    Review of Systems  Genitourinary:  Positive for frequency.    Updated Vital Signs BP 120/86 (BP Location: Right Arm)   Pulse 93   Temp (!) 97.5 F (36.4 C) (Oral)   Resp 20   SpO2 100%   Physical Exam Vitals and nursing  note reviewed.  Constitutional:      Appearance: He is well-developed.  HENT:     Head: Normocephalic and atraumatic.  Eyes:     Pupils: Pupils are equal, round, and reactive to light.  Neck:     Vascular: No JVD.  Cardiovascular:     Rate and Rhythm: Normal rate and regular rhythm.     Heart sounds: No murmur heard.    No friction rub. No gallop.  Pulmonary:     Effort: No respiratory distress.     Breath sounds: No wheezing.  Abdominal:     General: There is no distension.     Tenderness: There is no abdominal tenderness. There is no guarding or rebound.  Genitourinary:    Comments: Circumcised. Musculoskeletal:        General: Normal range of motion.     Cervical back: Normal range of motion and neck supple.  Skin:    Coloration: Skin is not pale.     Findings: No rash.  Neurological:     Mental Status: He is alert and oriented to person, place, and time.  Psychiatric:        Behavior: Behavior normal.     (all labs ordered are listed, but only abnormal results are displayed) Labs Reviewed  URINALYSIS, ROUTINE W REFLEX MICROSCOPIC - Abnormal; Notable for the following components:      Result  Value   APPearance CLOUDY (*)    Hgb urine dipstick LARGE (*)    Protein, ur 100 (*)    Leukocytes,Ua LARGE (*)    All other components within normal limits  RPR  HIV ANTIBODY (ROUTINE TESTING W REFLEX)  GC/CHLAMYDIA PROBE AMP ([place]) NOT AT [facility]    EKG: None  Radiology: CT Renal Stone Study Result Date: 05/24/2024 CLINICAL DATA:  Bilateral flank pain. EXAM: CT ABDOMEN AND PELVIS WITHOUT CONTRAST TECHNIQUE: Multidetector CT imaging of the abdomen and pelvis was performed following the standard protocol without IV contrast. RADIATION DOSE REDUCTION: This exam was performed according to the departmental dose-optimization program which includes automated exposure control, adjustment of the mA and/or kV according to patient size and/or use of iterative reconstruction  technique. COMPARISON:  None Available. FINDINGS: Lower chest: No acute abnormality. Hepatobiliary: No focal liver abnormality is seen. No gallstones, gallbladder wall thickening, or biliary dilatation. Pancreas: Unremarkable. No pancreatic ductal dilatation or surrounding inflammatory changes. Spleen: Normal in size without focal abnormality. Adrenals/Urinary Tract: Adrenal glands are unremarkable. Kidneys are normal, without renal calculi, focal lesion, or hydronephrosis. Bladder is unremarkable. Stomach/Bowel: Stomach is within normal limits. Appendix appears normal. No evidence of bowel wall thickening, distention, or inflammatory changes. Vascular/Lymphatic: No significant vascular findings are present. No enlarged abdominal or pelvic lymph nodes. Reproductive: Prostate is unremarkable. Other: No abdominal wall hernia or abnormality. No abdominopelvic ascites. Musculoskeletal: No acute or significant osseous findings. IMPRESSION: No acute abnormality seen in the abdomen or pelvis. Electronically Signed   By: Lynwood Landy Raddle M.D.   On: 05/24/2024 13:55     Procedures   Medications Ordered in the ED  cefTRIAXone  (ROCEPHIN ) 1 g in sodium chloride  0.9 % 100 mL IVPB (has no administration in time range)  ketorolac  (TORADOL ) 15 MG/ML injection 15 mg (15 mg Intravenous Given 05/24/24 1352)  azithromycin  (ZITHROMAX ) tablet 1,000 mg (1,000 mg Oral Given 05/24/24 1444)  metroNIDAZOLE  (FLAGYL ) tablet 2,000 mg (2,000 mg Oral Given 05/24/24 1444)                                    Medical Decision Making Amount and/or Complexity of Data Reviewed Labs: ordered. Radiology: ordered.  Risk Prescription drug management.   33 yo M with a chief complaints of dysuria increased frequency and hesitancy.  Has been going on for about 48 hours.  No history of urinary tract infection in the past.  I discussed my concern for sexually transmitted disease which he thinks is unlikely.  Will send off STD testing.  Await  UA.  UA with large reds and large whites with no noted bacteria seen.  Unsure of the clinical significance of this.  Will obtain a CT stone study to assess further.  CT without obvious ureterolithiasis.  No other obvious cause of the patient's symptoms.  I am going to give him Rocephin  and azithromycin  here to cover for possible STD.  With him having symptoms perhaps consistent with prostatitis will do prolonged course of oral antibiotics for home.  Urology follow-up.  2:47 PM:  I have discussed the diagnosis/risks/treatment options with the patient.  Evaluation and diagnostic testing in the emergency department does not suggest an emergent condition requiring admission or immediate intervention beyond what has been performed at this time.  They will follow up with Urology, PCP. We also discussed returning to the ED immediately if new or worsening sx occur. We  discussed the sx which are most concerning (e.g., sudden worsening pain, fever, inability to tolerate by mouth) that necessitate immediate return. Medications administered to the patient during their visit and any new prescriptions provided to the patient are listed below.  Medications given during this visit Medications  cefTRIAXone  (ROCEPHIN ) 1 g in sodium chloride  0.9 % 100 mL IVPB (has no administration in time range)  ketorolac  (TORADOL ) 15 MG/ML injection 15 mg (15 mg Intravenous Given 05/24/24 1352)  azithromycin  (ZITHROMAX ) tablet 1,000 mg (1,000 mg Oral Given 05/24/24 1444)  metroNIDAZOLE  (FLAGYL ) tablet 2,000 mg (2,000 mg Oral Given 05/24/24 1444)     The patient appears reasonably screen and/or stabilized for discharge and I doubt any other medical condition or other [facility] requiring further screening, evaluation, or treatment in the ED at this time prior to discharge.       Final diagnoses:  Acute prostatitis    ED Discharge Orders          Ordered    sulfamethoxazole -trimethoprim  (BACTRIM  DS) 800-160 MG tablet  2 times  daily        05/24/24 1430               Emil Share, DO 05/24/24 1447

## 2024-05-25 LAB — RPR: RPR Ser Ql: NONREACTIVE

## 2024-05-25 LAB — GC/CHLAMYDIA PROBE AMP (~~LOC~~) NOT AT ARMC
Chlamydia: NEGATIVE
Comment: NEGATIVE
Comment: NORMAL
Neisseria Gonorrhea: NEGATIVE

## 2024-07-10 ENCOUNTER — Encounter (HOSPITAL_COMMUNITY): Payer: Self-pay

## 2024-07-10 ENCOUNTER — Emergency Department (HOSPITAL_COMMUNITY)
Admission: EM | Admit: 2024-07-10 | Discharge: 2024-07-10 | Disposition: A | Discharge status: Home / Self Care | Payer: Self-pay | Attending: Emergency Medicine | Admitting: Emergency Medicine

## 2024-07-10 ENCOUNTER — Emergency Department (HOSPITAL_COMMUNITY): Payer: Self-pay

## 2024-07-10 ENCOUNTER — Other Ambulatory Visit: Payer: Self-pay

## 2024-07-10 DIAGNOSIS — W19XXXA Unspecified fall, initial encounter: Secondary | ICD-10-CM | POA: Insufficient documentation

## 2024-07-10 DIAGNOSIS — S82892A Other fracture of left lower leg, initial encounter for closed fracture: Secondary | ICD-10-CM

## 2024-07-10 DIAGNOSIS — S8262XA Displaced fracture of lateral malleolus of left fibula, initial encounter for closed fracture: Secondary | ICD-10-CM | POA: Insufficient documentation

## 2024-07-10 MED ORDER — ACETAMINOPHEN 325 MG PO TABS
650.0000 mg | ORAL_TABLET | Freq: Once | ORAL | Status: AC
  Administered 2024-07-10: 650 mg via ORAL
  Filled 2024-07-10: qty 2

## 2024-07-10 MED ORDER — HYDROCODONE-ACETAMINOPHEN 5-325 MG PO TABS
1.0000 | ORAL_TABLET | Freq: Three times a day (TID) | ORAL | 0 refills | Status: AC | PRN
Start: 2024-07-10 — End: ?

## 2024-07-10 MED ORDER — ACETAMINOPHEN 500 MG PO TABS
500.0000 mg | ORAL_TABLET | Freq: Four times a day (QID) | ORAL | 0 refills | Status: AC | PRN
Start: 2024-07-10 — End: ?
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
## 2024-07-10 NOTE — Progress Notes (Signed)
 Orthopedic Tech Progress Note Patient Details:  Casey Henry 1991/04/28 [id number] Applied short leg and stirrup splint per order. Ortho Devices Type of Ortho Device: Ace wrap, Cotton web roll, Stirrup splint, Short leg splint, Crutches Ortho Device/Splint Location: LLE Ortho Device/Splint Interventions: Ordered, Application, Adjustment   Post Interventions Patient Tolerated: Well Instructions Provided: Adjustment of device, Care of device, Poper ambulation with device  Morna Pink 07/10/2024, 10:54 AM

## 2024-07-10 NOTE — Discharge Instructions (Addendum)
 The x-ray in the emergency room reveals that you have a distal fibular fracture/ankle fracture.  Keep the leg elevated.  No weightbearing allowed, use crutches for ambulation.  Take Tylenol  for 100 mg every 6 hours.  Take the narcotic medicine only if the pain is excruciating.
